# Patient Record
Sex: Female | Born: 1980 | Race: Black or African American | Hispanic: No | Marital: Single | Smoking: Current every day smoker
Health system: Southern US, Community
[De-identification: ages and names within clinical notes are randomized; demographics above are authoritative.]

## PROBLEM LIST (undated history)

## (undated) DIAGNOSIS — J45909 Unspecified asthma, uncomplicated: Secondary | ICD-10-CM

## (undated) DIAGNOSIS — N939 Abnormal uterine and vaginal bleeding, unspecified: Secondary | ICD-10-CM

## (undated) HISTORY — DX: Unspecified asthma, uncomplicated: J45.909

---
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
## 1999-07-02 ENCOUNTER — Other Ambulatory Visit: Admission: RE | Admit: 1999-07-02 | Discharge: 1999-07-02 | Payer: Self-pay | Admitting: Obstetrics and Gynecology

## 1999-09-11 ENCOUNTER — Inpatient Hospital Stay (HOSPITAL_COMMUNITY): Admission: AD | Admit: 1999-09-11 | Discharge: 1999-09-13 | Payer: Self-pay | Admitting: *Deleted

## 2003-09-26 ENCOUNTER — Ambulatory Visit: Payer: Self-pay | Admitting: Family Medicine

## 2003-09-26 ENCOUNTER — Inpatient Hospital Stay (HOSPITAL_COMMUNITY): Admission: AD | Admit: 2003-09-26 | Discharge: 2003-09-29 | Payer: Self-pay | Admitting: Family Medicine

## 2004-07-20 ENCOUNTER — Ambulatory Visit: Payer: Self-pay | Admitting: Psychiatry

## 2004-07-20 ENCOUNTER — Encounter: Payer: Self-pay | Admitting: Emergency Medicine

## 2004-07-20 ENCOUNTER — Inpatient Hospital Stay (HOSPITAL_COMMUNITY): Admission: RE | Admit: 2004-07-20 | Discharge: 2004-07-23 | Payer: Self-pay | Admitting: Psychiatry

## 2005-02-02 ENCOUNTER — Inpatient Hospital Stay (HOSPITAL_COMMUNITY): Admission: AD | Admit: 2005-02-02 | Discharge: 2005-02-03 | Payer: Self-pay | Admitting: Family Medicine

## 2005-06-11 ENCOUNTER — Ambulatory Visit: Payer: Self-pay | Admitting: *Deleted

## 2005-06-11 ENCOUNTER — Inpatient Hospital Stay (HOSPITAL_COMMUNITY): Admission: AD | Admit: 2005-06-11 | Discharge: 2005-06-13 | Payer: Self-pay | Admitting: *Deleted

## 2007-06-26 IMAGING — US US OB COMP +14 WK
1 series · 13 of 28 positions shown · non-contrast
Comparison: none

CLINICAL DATA: 24-year-old female with estimated 23 week gestation.  No prenatal care.  Pelvic pain and discharge.

[Series 1: us ob comp +14 wk · 13 of 47 slices shown]
[im 2/47]
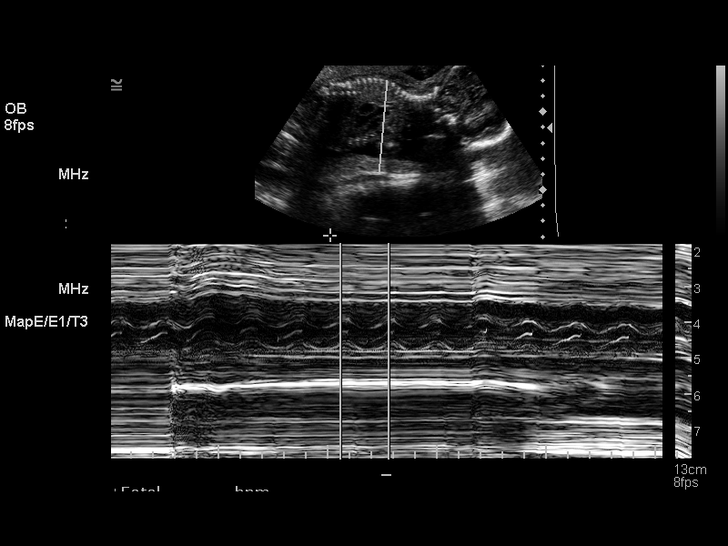
[im 6/47]
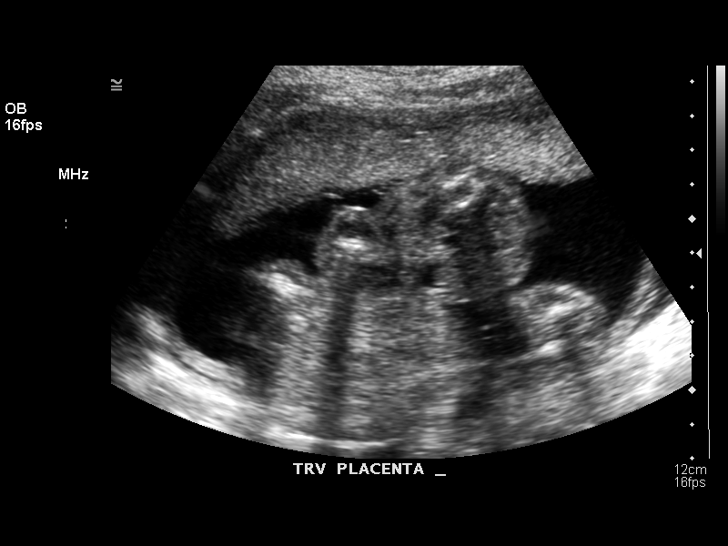
[im 9/47]
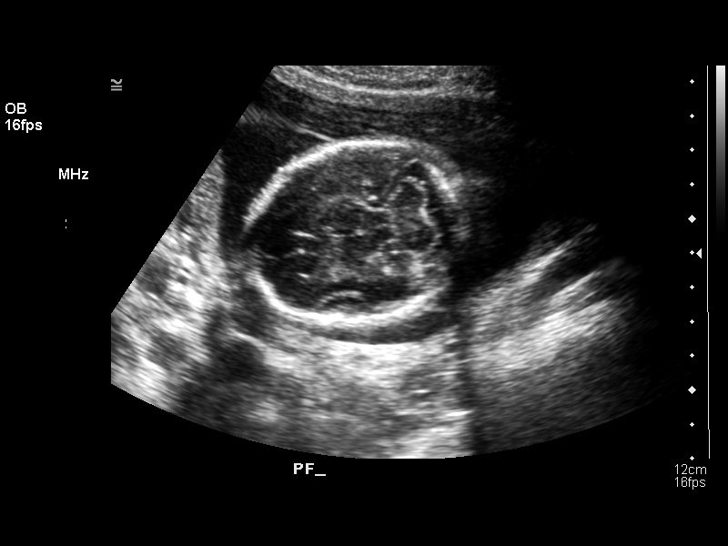
[im 12/47]
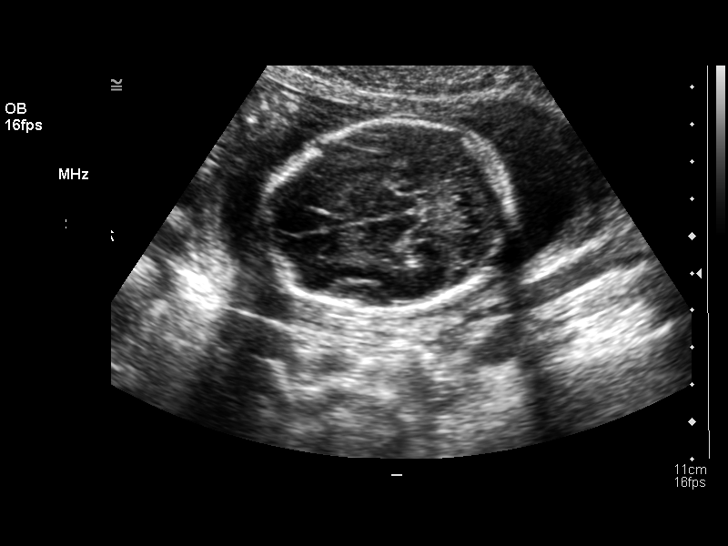
[im 16/47]
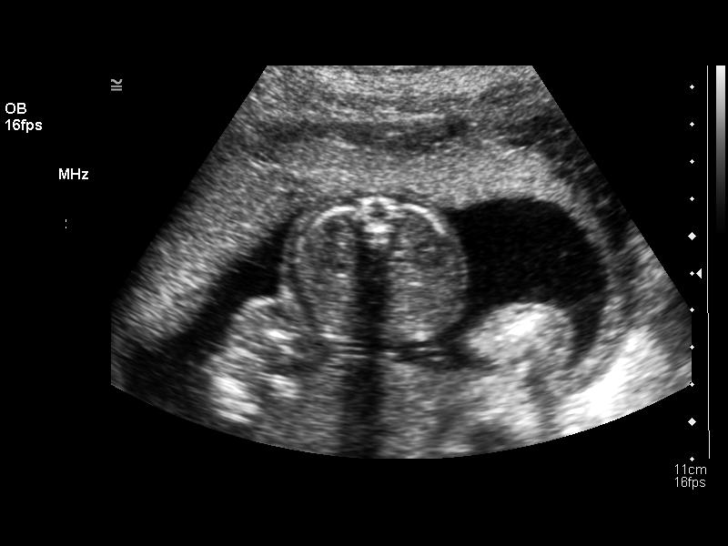
[im 19/47]
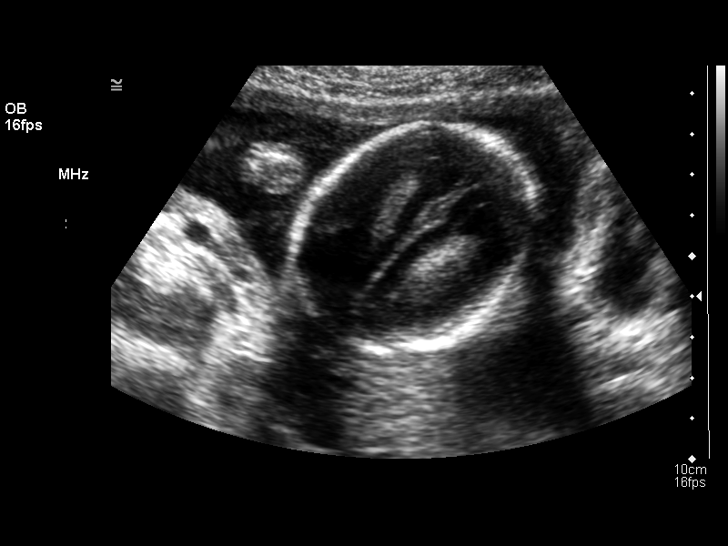
[im 24/47]
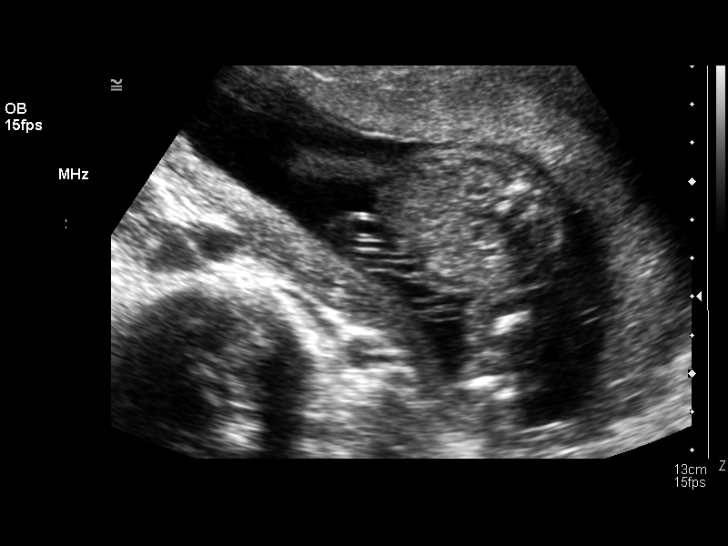
[im 28/47]
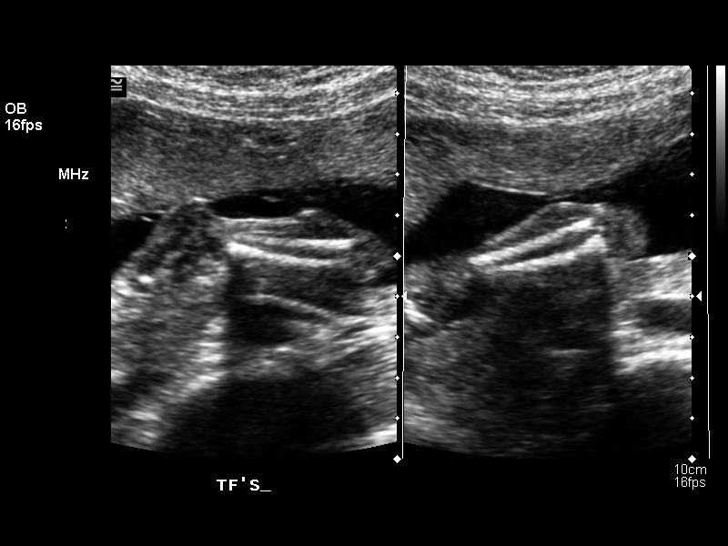
[im 31/47]
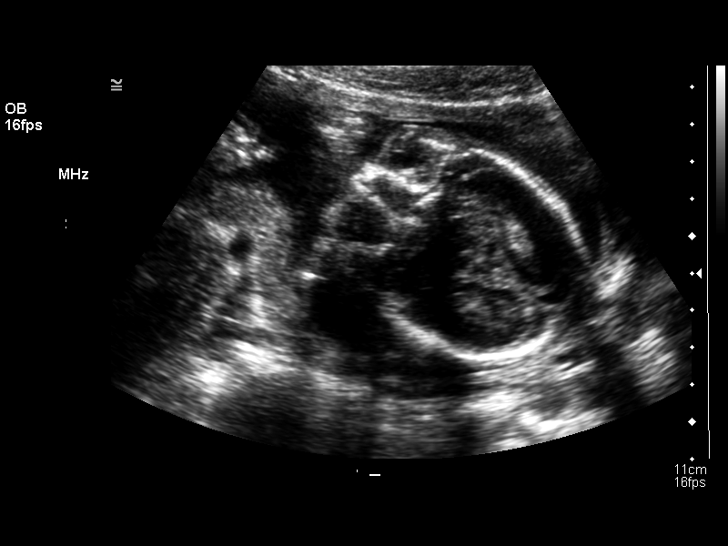
[im 35/47]
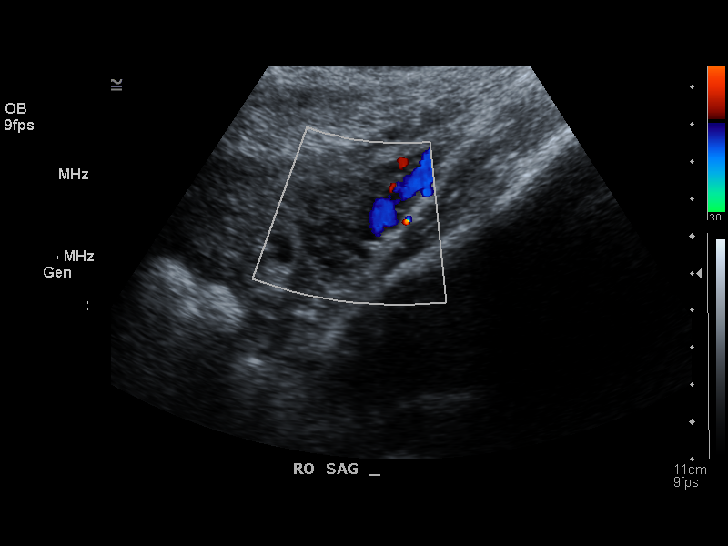
[im 38/47]
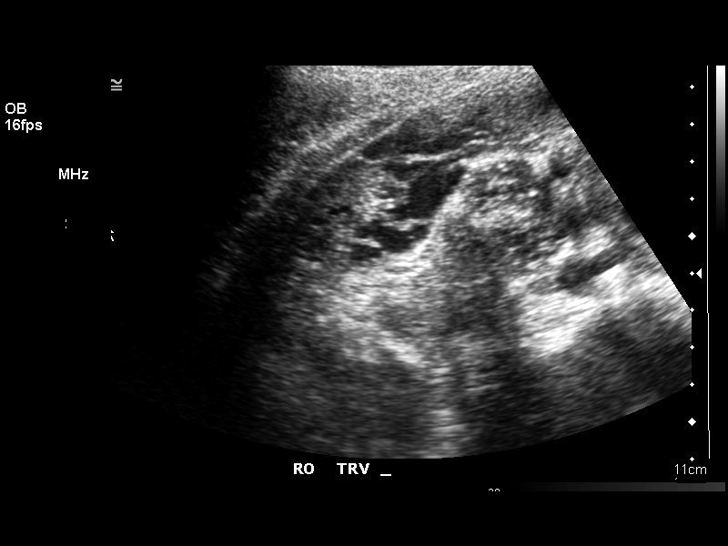
[im 41/47]
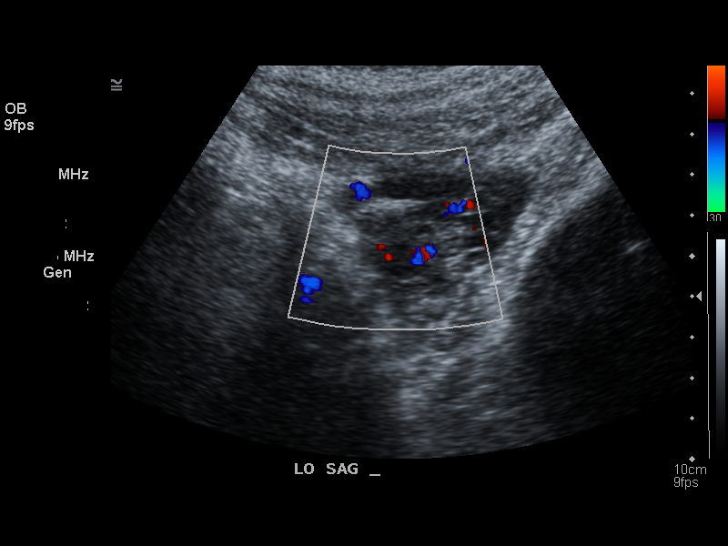
[im 45/47]
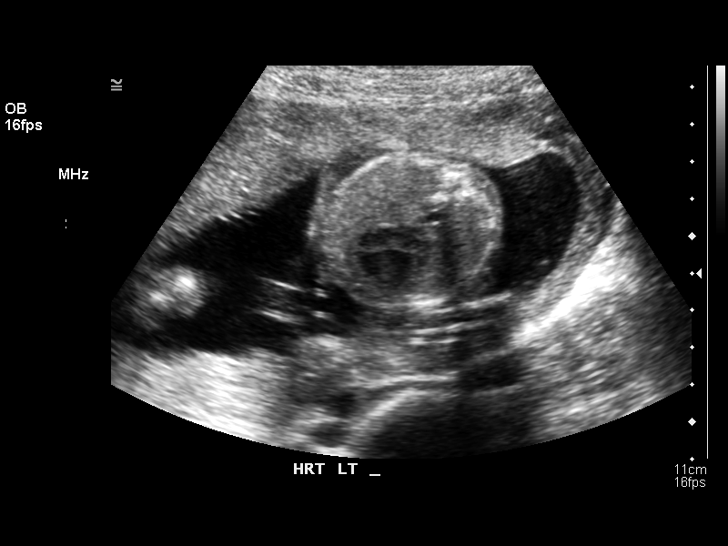

[13 of 28 positions shown; findings below may reference images not displayed]

OBSTETRICAL ULTRASOUND:
 Number of Fetuses: 1
 Heart Rate:  136
 Movement:  Yes
 Breathing:  No  
 Presentation:  Cephalic
 Placental Location:  Anterior
 Grade:  I
 Previa: No
 Amniotic Fluid (Subjective):  Normal
 Amniotic Fluid (Objective):   4.1 cm Vertical pocket 

 FETAL BIOMETRY
 BPD:   5.2 cm  21 w 5 d
 HC:   19.5 cm  21 w 5 d
 AC:   16.0 cm   21 w 0 d
 FL:    3.7 cm  21 w 5 d

 MEAN GA:  21 w 4 d  US EDC:  06/11/05

 FETAL ANATOMY
 Lateral Ventricles:    Visualized 
 Thalami/CSP:      Visualized 
 Posterior Fossa:  Visualized 
 Nuchal Region:    N/A
 Spine:      Not visualized 
 4 Chamber Heart on Left:      Not visualized 
 Stomach on Left:      Visualized 
 3 Vessel Cord:    Visualized 
 Cord Insertion site:    Visualized 
 Kidneys:  Visualized 
 Bladder:  Visualized 
 Extremities:      Visualized 

 ADDITIONAL ANATOMY VISUALIZED:  Orbits, diaphragm, heel, and 5th digit.

 Evaluation limited by:  Fetal position.

 MATERNAL UTERINE AND ADNEXAL FINDINGS
 Cervix:   3.5 cm Transabdominally.  Normal ovaries.
IMPRESSION: 1.  Single living intrauterine gestation in cephalic presentation with gestational age of 21 weeks 4 days by today?s ultrasound.
 2.  Fetal anatomy as described.  Spine, heart and nuchal regions were not well-evaluated secondary to fetal position.  Consider follow-up.

## 2011-12-03 ENCOUNTER — Emergency Department (HOSPITAL_COMMUNITY)
Admission: EM | Admit: 2011-12-03 | Discharge: 2011-12-03 | Disposition: A | Discharge status: Home / Self Care | Payer: Self-pay | Attending: Emergency Medicine | Admitting: Emergency Medicine

## 2011-12-03 ENCOUNTER — Emergency Department (HOSPITAL_COMMUNITY): Payer: Self-pay

## 2011-12-03 ENCOUNTER — Encounter (HOSPITAL_COMMUNITY): Payer: Self-pay | Admitting: *Deleted

## 2011-12-03 DIAGNOSIS — F17200 Nicotine dependence, unspecified, uncomplicated: Secondary | ICD-10-CM | POA: Insufficient documentation

## 2011-12-03 DIAGNOSIS — Y9389 Activity, other specified: Secondary | ICD-10-CM | POA: Insufficient documentation

## 2011-12-03 DIAGNOSIS — S92415D Nondisplaced fracture of proximal phalanx of left great toe, subsequent encounter for fracture with routine healing: Secondary | ICD-10-CM

## 2011-12-03 DIAGNOSIS — S92919A Unspecified fracture of unspecified toe(s), initial encounter for closed fracture: Secondary | ICD-10-CM | POA: Insufficient documentation

## 2011-12-03 DIAGNOSIS — Y92009 Unspecified place in unspecified non-institutional (private) residence as the place of occurrence of the external cause: Secondary | ICD-10-CM | POA: Insufficient documentation

## 2011-12-03 DIAGNOSIS — W2203XA Walked into furniture, initial encounter: Secondary | ICD-10-CM | POA: Insufficient documentation

## 2011-12-03 MED ORDER — OXYCODONE-ACETAMINOPHEN 5-325 MG PO TABS: 2.0000 | ORAL_TABLET | ORAL | Status: DC | PRN

## 2011-12-03 MED ORDER — IBUPROFEN 800 MG PO TABS
800.0000 mg | ORAL_TABLET | Freq: Once | ORAL | Status: AC
  Administered 2011-12-03: 800 mg via ORAL
  Filled 2011-12-03: qty 1

## 2011-12-03 NOTE — ED Provider Notes (Signed)
Medical screening examination/treatment/procedure(s) were performed by non-physician practitioner and as supervising physician I was immediately available for consultation/collaboration.  Dlynn Ranes, MD 12/03/11 2236 

## 2011-12-03 NOTE — ED Notes (Signed)
Pt reports that she was rearranging furniture and dropped something on her Rt Great toe; c/o pain and swelling to toe, unable to bend without pain.

## 2011-12-03 NOTE — ED Provider Notes (Signed)
History     CSN: [id number]  Arrival date & time 12/03/11  2018   First MD Initiated Contact with Patient 12/03/11 2106      Chief Complaint  Patient presents with  . Toe Injury    (Consider location/radiation/quality/duration/timing/severity/associated sxs/prior treatment) Patient is a 31 y.o. female presenting with lower extremity pain. The history is provided by the patient. No language interpreter was used.  Foot Pain This is a new problem. The current episode started today. The problem occurs constantly. The problem has been unchanged. Associated symptoms include joint swelling. Pertinent negatives include no nausea, vomiting or weakness. The symptoms are aggravated by walking. She has tried NSAIDs for the symptoms. The treatment provided mild relief.  31yo female with R great toe injury after dropping furniture on her toe.  Toe is inflamed and painful. No deformity noted.   History reviewed. No pertinent past medical history.  History reviewed. No pertinent past surgical history.  No family history on file.  History  Substance Use Topics  . Smoking status: Current Every Day Smoker -- 0.5 packs/day    Types: Cigarettes  . Smokeless tobacco: Not on file  . Alcohol Use: No    OB History    Grav Para Term Preterm Abortions TAB SAB Ect Mult Living                  Review of Systems  Constitutional: Negative.   HENT: Negative.   Eyes: Negative.   Respiratory: Negative.   Cardiovascular: Negative.   Gastrointestinal: Negative.  Negative for nausea and vomiting.  Musculoskeletal: Positive for joint swelling and gait problem. Negative for back pain.       R great toe pain  Neurological: Negative.  Negative for weakness.  Psychiatric/Behavioral: Negative.   All other systems reviewed and are negative.    Allergies  Review of patient's allergies indicates no known allergies.  Home Medications   Current Outpatient Rx  Name  Route  Sig  Dispense  Refill  .  IBUPROFEN 200 MG PO TABS   Oral   Take 600 mg by mouth every 6 (six) hours as needed.         Marland Kitchen PARAGARD INTRAUTERINE COPPER IU   Intrauterine   1 each by Intrauterine route continuous. Birth control. Copper IUD.           BP 132/81  Pulse 91  Temp 98.9 F (37.2 C) (Oral)  Resp 18  Ht 5\' 3"  (1.6 m)  Wt 140 lb (63.504 kg)  BMI 24.80 kg/m2  SpO2 99%  LMP 11/25/2011  Physical Exam  Nursing note and vitals reviewed. Constitutional: She is oriented to person, place, and time. She appears well-developed and well-nourished.  HENT:  Head: Normocephalic and atraumatic.  Eyes: Conjunctivae normal and EOM are normal. Pupils are equal, round, and reactive to light.  Neck: Normal range of motion. Neck supple.  Cardiovascular: Normal rate.   Pulmonary/Chest: Effort normal.  Abdominal: Soft.  Musculoskeletal: Normal range of motion. She exhibits no edema and no tenderness.       R great toe tenderness no deformity +cms  Neurological: She is alert and oriented to person, place, and time. She has normal reflexes.  Skin: Skin is warm and dry.  Psychiatric: She has a normal mood and affect.    ED Course  Procedures (including critical care time)  Labs Reviewed - No data to display Dg Toe Great Right  12/03/2011  *RADIOLOGY REPORT*  Clinical Data: Or drop for  sure on right great toe, now with pain and swelling throughout the digit  RIGHT GREAT TOE  Comparison: None.  Findings:  There is a nondisplaced complex oblique fracture of the proximal phalanx of the great toe/first digit without definite intra- articular extension.  Regional soft tissues are normal.  No radiopaque foreign body.  Joint spaces are preserved.  IMPRESSION: Nondisplaced complex oblique fracture of the proximal phalanx of the great toe without definite intra-articular extension.  No radiopaque foreign body.   Original Report Authenticated By: Tacey Ruiz, MD      1. Fx R great toe    MDM  R great toe fx  nondisplaced reviewed by myself.   Post op boot applied.  Will get cane at [employer].  Iburprofen RICE and percocet for pain.  Return to ER for severe pain or hot to touch/color changes.  Follow up Dr Shelle Iron with ortho in the next week.            Remi Haggard, NP 12/03/11 2115

## 2012-10-23 ENCOUNTER — Inpatient Hospital Stay (HOSPITAL_COMMUNITY)
Admission: AD | Admit: 2012-10-23 | Discharge: 2012-10-23 | Disposition: A | Discharge status: Home / Self Care | Payer: Self-pay | Source: Ambulatory Visit | Attending: Obstetrics and Gynecology | Admitting: Obstetrics and Gynecology

## 2012-10-23 ENCOUNTER — Encounter (HOSPITAL_COMMUNITY): Payer: Self-pay | Admitting: *Deleted

## 2012-10-23 DIAGNOSIS — D649 Anemia, unspecified: Secondary | ICD-10-CM | POA: Insufficient documentation

## 2012-10-23 DIAGNOSIS — A5901 Trichomonal vulvovaginitis: Secondary | ICD-10-CM | POA: Insufficient documentation

## 2012-10-23 DIAGNOSIS — N938 Other specified abnormal uterine and vaginal bleeding: Secondary | ICD-10-CM | POA: Insufficient documentation

## 2012-10-23 DIAGNOSIS — N949 Unspecified condition associated with female genital organs and menstrual cycle: Secondary | ICD-10-CM | POA: Insufficient documentation

## 2012-10-23 HISTORY — DX: Abnormal uterine and vaginal bleeding, unspecified: N93.9

## 2012-10-23 LAB — URINALYSIS, ROUTINE W REFLEX MICROSCOPIC
Bilirubin Urine: NEGATIVE
Glucose, UA: NEGATIVE mg/dL
Ketones, ur: NEGATIVE mg/dL
Protein, ur: NEGATIVE mg/dL
Urobilinogen, UA: 1 mg/dL (ref 0.0–1.0)
pH: 6 (ref 5.0–8.0)

## 2012-10-23 LAB — WET PREP, GENITAL

## 2012-10-23 LAB — URINE MICROSCOPIC-ADD ON

## 2012-10-23 LAB — CBC
HCT: 27.9 % — ABNORMAL LOW (ref 36.0–46.0)
Hemoglobin: 9.3 g/dL — ABNORMAL LOW (ref 12.0–15.0)
MCH: 28 pg (ref 26.0–34.0)
MCV: 84 fL (ref 78.0–100.0)
RBC: 3.32 MIL/uL — ABNORMAL LOW (ref 3.87–5.11)
WBC: 5.3 10*3/uL (ref 4.0–10.5)

## 2012-10-23 MED ORDER — ONDANSETRON 4 MG PO TBDP
4.0000 mg | ORAL_TABLET | Freq: Once | ORAL | Status: AC
  Administered 2012-10-23: 4 mg via ORAL
  Filled 2012-10-23: qty 1

## 2012-10-23 MED ORDER — MEGESTROL ACETATE 20 MG PO TABS: 20.0000 mg | ORAL_TABLET | Freq: Every day | ORAL | Status: DC

## 2012-10-23 MED ORDER — METRONIDAZOLE 500 MG PO TABS
2000.0000 mg | ORAL_TABLET | Freq: Once | ORAL | Status: AC
  Administered 2012-10-23: 2000 mg via ORAL
  Filled 2012-10-23: qty 4

## 2012-10-23 NOTE — MAU Provider Note (Signed)
History     CSN: [id number]  Arrival date and time: 10/23/12 1313   First Provider Initiated Contact with Patient 10/23/12 1456      Chief Complaint  Patient presents with  . Vaginal Bleeding   HPI  [name] is a 32 y.o. non-pregnant female [phone] who presents with vaginal bleeding. She has a copper IUD in place that she has had for 5 years without problems. She started bleeding September 28, 2012 and the bleeding has continued everyday. She is changing/ soaking a pad every 20 mins. She has bought over night pads and depends and still continues to soak them.   OB History   Grav Para Term Preterm Abortions TAB SAB Ect Mult Living   5 5 4 1      5       Past Medical History  Diagnosis Date  . Abnormal vaginal bleeding     Past Surgical History  Procedure Laterality Date  . Vaginal delivery      History reviewed. No pertinent family history.  History  Substance Use Topics  . Smoking status: Current Every Day Smoker -- 0.50 packs/day    Types: Cigarettes  . Smokeless tobacco: Not on file  . Alcohol Use: Yes     Comment: occas.    Allergies: No Known Allergies  Prescriptions prior to admission  Medication Sig Dispense Refill  . ibuprofen (ADVIL,MOTRIN) 200 MG tablet Take 600 mg by mouth every 6 (six) hours as needed.      Marland Kitchen PARAGARD INTRAUTERINE COPPER IU 1 each by Intrauterine route continuous. Birth control. Copper IUD.       Results for orders placed during the hospital encounter of 10/23/12 (from the past 24 hour(s))  URINALYSIS, ROUTINE W REFLEX MICROSCOPIC     Status: Abnormal   Collection Time    10/23/12  1:30 PM      Result Value Range   Color, Urine YELLOW  YELLOW   APPearance CLOUDY (*) CLEAR   Specific Gravity, Urine >1.030 (*) 1.005 - 1.030   pH 6.0  5.0 - 8.0   Glucose, UA NEGATIVE  NEGATIVE mg/dL   Hgb urine dipstick LARGE (*) NEGATIVE   Bilirubin Urine NEGATIVE  NEGATIVE   Ketones, ur NEGATIVE  NEGATIVE mg/dL   Protein, ur  NEGATIVE  NEGATIVE mg/dL   Urobilinogen, UA 1.0  0.0 - 1.0 mg/dL   Nitrite NEGATIVE  NEGATIVE   Leukocytes, UA NEGATIVE  NEGATIVE  URINE MICROSCOPIC-ADD ON     Status: Abnormal   Collection Time    10/23/12  1:30 PM      Result Value Range   Squamous Epithelial / LPF FEW (*) RARE   RBC / HPF TOO NUMEROUS TO COUNT  <3 RBC/hpf   Bacteria, UA FEW (*) RARE   Urine-Other MUCOUS PRESENT    POCT PREGNANCY, URINE     Status: None   Collection Time    10/23/12  1:38 PM      Result Value Range   Preg Test, Ur NEGATIVE  NEGATIVE  CBC     Status: Abnormal   Collection Time    10/23/12  2:46 PM      Result Value Range   WBC 5.3  4.0 - 10.5 K/uL   RBC 3.32 (*) 3.87 - 5.11 MIL/uL   Hemoglobin 9.3 (*) 12.0 - 15.0 g/dL   HCT 13.0 (*) 86.5 - 78.4 %   MCV 84.0  78.0 - 100.0 fL   MCH 28.0  26.0 -  34.0 pg   MCHC 33.3  30.0 - 36.0 g/dL   RDW 78.2  95.6 - 21.3 %   Platelets 213  150 - 400 K/uL   Results for orders placed during the hospital encounter of 10/23/12 (from the past 24 hour(s))  URINALYSIS, ROUTINE W REFLEX MICROSCOPIC     Status: Abnormal   Collection Time    10/23/12  1:30 PM      Result Value Range   Color, Urine YELLOW  YELLOW   APPearance CLOUDY (*) CLEAR   Specific Gravity, Urine >1.030 (*) 1.005 - 1.030   pH 6.0  5.0 - 8.0   Glucose, UA NEGATIVE  NEGATIVE mg/dL   Hgb urine dipstick LARGE (*) NEGATIVE   Bilirubin Urine NEGATIVE  NEGATIVE   Ketones, ur NEGATIVE  NEGATIVE mg/dL   Protein, ur NEGATIVE  NEGATIVE mg/dL   Urobilinogen, UA 1.0  0.0 - 1.0 mg/dL   Nitrite NEGATIVE  NEGATIVE   Leukocytes, UA NEGATIVE  NEGATIVE  URINE MICROSCOPIC-ADD ON     Status: Abnormal   Collection Time    10/23/12  1:30 PM      Result Value Range   Squamous Epithelial / LPF FEW (*) RARE   RBC / HPF TOO NUMEROUS TO COUNT  <3 RBC/hpf   Bacteria, UA FEW (*) RARE   Urine-Other MUCOUS PRESENT    POCT PREGNANCY, URINE     Status: None   Collection Time    10/23/12  1:38 PM      Result Value  Range   Preg Test, Ur NEGATIVE  NEGATIVE  CBC     Status: Abnormal   Collection Time    10/23/12  2:46 PM      Result Value Range   WBC 5.3  4.0 - 10.5 K/uL   RBC 3.32 (*) 3.87 - 5.11 MIL/uL   Hemoglobin 9.3 (*) 12.0 - 15.0 g/dL   HCT 08.6 (*) 57.8 - 46.9 %   MCV 84.0  78.0 - 100.0 fL   MCH 28.0  26.0 - 34.0 pg   MCHC 33.3  30.0 - 36.0 g/dL   RDW 62.9  52.8 - 41.3 %   Platelets 213  150 - 400 K/uL  WET PREP, GENITAL     Status: Abnormal   Collection Time    10/23/12  3:10 PM      Result Value Range   Yeast Wet Prep HPF POC NONE SEEN  NONE SEEN   Trich, Wet Prep FEW (*) NONE SEEN   Clue Cells Wet Prep HPF POC NONE SEEN  NONE SEEN   WBC, Wet Prep HPF POC FEW (*) NONE SEEN    Review of Systems  Constitutional: Negative for fever and chills.  Gastrointestinal: Positive for abdominal pain. Negative for nausea, vomiting, diarrhea and constipation.       Right sided pelvic pain   Genitourinary: Negative for dysuria, urgency, frequency and hematuria.       No vaginal discharge. + vaginal bleeding; heavy No dysuria.   Neurological: Positive for weakness. Negative for dizziness.   Physical Exam   Blood pressure 118/73, pulse 86, temperature 98.4 F (36.9 C), temperature source Oral, resp. rate 18, height 5' 3.5" (1.613 m), weight 65.772 kg (145 lb), last menstrual period 09/28/2012.  Physical Exam  Constitutional: She is oriented to person, place, and time. She appears well-developed and well-nourished. No distress.  Eyes: Pupils are equal, round, and reactive to light.  Neck: Normal range of motion.  Respiratory: Effort normal.  GI: Soft. She exhibits no distension. There is no tenderness. There is no rebound and no guarding.  Genitourinary: Vaginal discharge found.  Speculum exam: Vagina - Small amount of frothy, bubbly, dark red blood in vaginal canal, strong odor, two dime size clots removed.  Cervix - moderate, active, frothy bleeding from os  IUD strings visualized   Bimanual exam: Cervix closed, no CMT Uterus non tender, normal size Adnexa non tender, no masses bilaterally GC/Chlam, wet prep done Chaperone present for exam.   Neurological: She is alert and oriented to person, place, and time.  Skin: Skin is warm and dry. She is not diaphoretic.    MAU Course  Procedures  MDM CBC Wet prep GC/Chlamydia   Consulted with Dr. Jolayne Panther and a treatment plan was discussed.   Assessment and Plan  A: Heavy vaginal bleeding Trichomonas Vaginitis Anemia   P Discharge home Bleeding precautions discussed  You have been diagnosed with a sexually transmitted disease.  You have been treated today and your partner(s) will need to be treated.  No sex until 10 days after you finished your medicine and no sex until 10 days after your partner has taken their medication. RX: Megace: Take two tablets 40 mg three times per day time three days,  then take two tablets 40 mg two times per day times three days, then take two tablets 40 mg once per day, then 1 tablet 40 mg PRN for bleeding  Return to MAU as needed, if symptoms worsen  Start Iron 325 mg BID; ferrous sulfate; take on an empty stomach 1 hour before meals Referral made to the clinic, they will call you to schedule appointment.   Natthew Marlatt IRENE FNP-C 10/23/2012, 4:54 PM

## 2012-10-23 NOTE — MAU Note (Signed)
Started her period about a month ago and has continued to bleed, varies in amount. Cramping on right side. Has copper IUD X 5 years. Has not had irregular bleeding until now.

## 2012-10-23 NOTE — MAU Provider Note (Signed)
Attestation of Attending Supervision of Advanced Practitioner (CNM/NP): Evaluation and management procedures were performed by the Advanced Practitioner under my supervision and collaboration.  I have reviewed the Advanced Practitioner's note and chart, and I agree with the management and plan.  Jerin Franzel 10/23/2012 5:06 PM

## 2012-10-24 LAB — GC/CHLAMYDIA PROBE AMP
CT Probe RNA: NEGATIVE
GC Probe RNA: NEGATIVE

## 2012-10-26 ENCOUNTER — Encounter: Payer: Self-pay | Admitting: Medical

## 2012-11-30 ENCOUNTER — Ambulatory Visit: Payer: Self-pay | Admitting: Medical

## 2013-11-25 ENCOUNTER — Encounter (HOSPITAL_COMMUNITY): Payer: Self-pay | Admitting: *Deleted

## 2013-11-29 ENCOUNTER — Emergency Department (HOSPITAL_COMMUNITY)
Admission: EM | Admit: 2013-11-29 | Discharge: 2013-11-29 | Disposition: A | Discharge status: Home / Self Care | Payer: No Typology Code available for payment source | Attending: Emergency Medicine | Admitting: Emergency Medicine

## 2013-11-29 ENCOUNTER — Encounter (HOSPITAL_COMMUNITY): Payer: Self-pay | Admitting: Emergency Medicine

## 2013-11-29 DIAGNOSIS — Z720 Tobacco use: Secondary | ICD-10-CM | POA: Insufficient documentation

## 2013-11-29 DIAGNOSIS — K529 Noninfective gastroenteritis and colitis, unspecified: Secondary | ICD-10-CM | POA: Insufficient documentation

## 2013-11-29 DIAGNOSIS — Z3202 Encounter for pregnancy test, result negative: Secondary | ICD-10-CM | POA: Insufficient documentation

## 2013-11-29 DIAGNOSIS — R112 Nausea with vomiting, unspecified: Secondary | ICD-10-CM | POA: Diagnosis present

## 2013-11-29 DIAGNOSIS — R51 Headache: Secondary | ICD-10-CM

## 2013-11-29 DIAGNOSIS — R519 Headache, unspecified: Secondary | ICD-10-CM

## 2013-11-29 LAB — PREGNANCY, URINE: PREG TEST UR: NEGATIVE

## 2013-11-29 LAB — CBC WITH DIFFERENTIAL/PLATELET
BASOS ABS: 0 10*3/uL (ref 0.0–0.1)
Basophils Relative: 0 % (ref 0–1)
Eosinophils Absolute: 0.1 10*3/uL (ref 0.0–0.7)
Eosinophils Relative: 1 % (ref 0–5)
HEMATOCRIT: 30.7 % — AB (ref 36.0–46.0)
Hemoglobin: 9.5 g/dL — ABNORMAL LOW (ref 12.0–15.0)
LYMPHS PCT: 39 % (ref 12–46)
Lymphs Abs: 2.4 10*3/uL (ref 0.7–4.0)
MCH: 24.9 pg — AB (ref 26.0–34.0)
MCHC: 30.9 g/dL (ref 30.0–36.0)
MCV: 80.6 fL (ref 78.0–100.0)
MONO ABS: 0.6 10*3/uL (ref 0.1–1.0)
Monocytes Relative: 10 % (ref 3–12)
NEUTROS ABS: 3.2 10*3/uL (ref 1.7–7.7)
NEUTROS PCT: 50 % (ref 43–77)
Platelets: 240 10*3/uL (ref 150–400)
RBC: 3.81 MIL/uL — ABNORMAL LOW (ref 3.87–5.11)
RDW: 18.3 % — AB (ref 11.5–15.5)
WBC: 6.3 10*3/uL (ref 4.0–10.5)

## 2013-11-29 LAB — COMPREHENSIVE METABOLIC PANEL
ALBUMIN: 3.4 g/dL — AB (ref 3.5–5.2)
ALT: 23 U/L (ref 0–35)
ANION GAP: 13 (ref 5–15)
AST: 25 U/L (ref 0–37)
Alkaline Phosphatase: 54 U/L (ref 39–117)
BUN: 6 mg/dL (ref 6–23)
CALCIUM: 9.3 mg/dL (ref 8.4–10.5)
CHLORIDE: 104 meq/L (ref 96–112)
CO2: 23 mEq/L (ref 19–32)
CREATININE: 0.64 mg/dL (ref 0.50–1.10)
GFR calc Af Amer: >90 mL/min (ref >90)
GLUCOSE: 93 mg/dL (ref 70–99)
Potassium: 4.1 mEq/L (ref 3.7–5.3)
SODIUM: 140 meq/L (ref 137–147)
TOTAL PROTEIN: 7.1 g/dL (ref 6.0–8.3)
Total Bilirubin: <0.2 mg/dL — ABNORMAL LOW (ref 0.3–1.2)

## 2013-11-29 LAB — LIPASE, BLOOD: LIPASE: 18 U/L (ref 11–59)

## 2013-11-29 LAB — URINALYSIS, ROUTINE W REFLEX MICROSCOPIC
BILIRUBIN URINE: NEGATIVE
Glucose, UA: NEGATIVE mg/dL
HGB URINE DIPSTICK: NEGATIVE
Ketones, ur: NEGATIVE mg/dL
Leukocytes, UA: NEGATIVE
NITRITE: NEGATIVE
PH: 7 (ref 5.0–8.0)
Protein, ur: NEGATIVE mg/dL
Specific Gravity, Urine: 1.02 (ref 1.005–1.030)
Urobilinogen, UA: 0.2 mg/dL (ref 0.0–1.0)

## 2013-11-29 MED ORDER — KETOROLAC TROMETHAMINE 30 MG/ML IJ SOLN
30.0000 mg | Freq: Once | INTRAMUSCULAR | Status: AC
  Administered 2013-11-29: 30 mg via INTRAVENOUS
  Filled 2013-11-29: qty 1

## 2013-11-29 MED ORDER — SODIUM CHLORIDE 0.9 % IV BOLUS (SEPSIS)
1000.0000 mL | Freq: Once | INTRAVENOUS | Status: AC
  Administered 2013-11-29: 1000 mL via INTRAVENOUS

## 2013-11-29 MED ORDER — PROMETHAZINE HCL 25 MG PO TABS: 25.0000 mg | ORAL_TABLET | Freq: Three times a day (TID) | ORAL | Status: DC | PRN

## 2013-11-29 MED ORDER — ONDANSETRON HCL 4 MG/2ML IJ SOLN
4.0000 mg | Freq: Once | INTRAMUSCULAR | Status: AC
  Administered 2013-11-29: 4 mg via INTRAVENOUS
  Filled 2013-11-29: qty 2

## 2013-11-29 MED ORDER — SODIUM CHLORIDE 0.9 % IV BOLUS (SEPSIS): 1000.0000 mL | Freq: Once | INTRAVENOUS | Status: DC

## 2013-11-29 NOTE — Progress Notes (Signed)
  CARE MANAGEMENT ED NOTE 11/29/2013  Patient:  Lisa Mack,Lisa Mack   Account Number:  [id number]  Date Initiated:  11/29/2013  Documentation initiated by:  Radford PaxFERRERO,Zayon Trulson  Subjective/Objective Assessment:   Patient presents to Ed with headache vomiting and diarrhea     Subjective/Objective Assessment Detail:     Action/Plan:   Action/Plan Detail:   Anticipated DC Date:       Status Recommendation to Physician:   Result of Recommendation:    Other ED Services  Consult Working Plan    DC Planning Services  Other  PCP issues    Choice offered to / List presented to:            Status of service:  Completed, signed off  ED Comments:   ED Comments Detail:  EDCM spoke to patient at bedside.  Patient confirms she has [employer] insurance without a pcp.  [facility] who accept [employer] insurnace within a ten mile radius of patient's zip [place] [phone].  Patient thankful for resources.  No further EDCM needs at this time.

## 2013-11-29 NOTE — ED Notes (Signed)
Pt c/o headache, vomiting, diarrhea.

## 2013-11-29 NOTE — Discharge Instructions (Signed)
Return here as needed.  Follow-up with a primary care doctor. slowly increase your fluid intake

## 2013-11-29 NOTE — ED Provider Notes (Signed)
CSN: [id number]     Arrival date & time 11/29/13  1719 History   First MD Initiated Contact with Patient 11/29/13 1848     Chief Complaint  Patient presents with  . Headache  . Emesis     (Consider location/radiation/quality/duration/timing/severity/associated sxs/prior Treatment) HPI Patient presents to the emergency department with headache, vomiting and diarrhea over the last 24 hours.  Patient states she started with a frontal headache yesterday and then developed nausea, vomiting and diarrhea.  The patient states that her headache has improved somewhat and her vomiting has lessened in the last few hours.  The patient states she did not take any medications prior to arrival.  The patient states that nothing seems to make her condition better or worse.  The patient denies chest pain, shortness of breath, weakness, dizziness, back pain, dysuria, hematuria, hematemesis, rash, fever, cough, runny nose, sore throat, near syncope or syncope Past Medical History  Diagnosis Date  . Abnormal vaginal bleeding    Past Surgical History  Procedure Laterality Date  . Vaginal delivery     History reviewed. No pertinent family history. History  Substance Use Topics  . Smoking status: Current Every Day Smoker -- 0.50 packs/day    Types: Cigarettes  . Smokeless tobacco: Not on file  . Alcohol Use: Yes     Comment: occas.   OB History    Gravida Para Term Preterm AB TAB SAB Ectopic Multiple Living   5 5 4 1      5      Review of Systems  All other systems negative except as documented in the HPI. All pertinent positives and negatives as reviewed in the HPI.  Allergies  Review of patient's allergies indicates no known allergies.  Home Medications   Prior to Admission medications   Medication Sig Start Date End Date Taking? Authorizing Provider  ibuprofen (ADVIL,MOTRIN) 200 MG tablet Take 600 mg by mouth every 6 (six) hours as needed.    Historical Provider, MD  megestrol (MEGACE) 20 MG  tablet Take 1 tablet (20 mg total) by mouth daily. 10/23/12   Debbrah AlarJennifer Irene Rasch, NP  PARAGARD INTRAUTERINE COPPER IU 1 each by Intrauterine route continuous. Birth control. Copper IUD.    Historical Provider, MD   BP 121/81 mmHg  Pulse 87  Temp(Src) 98.8 F (37.1 C) (Oral)  Resp 16  SpO2 98% Physical Exam  Constitutional: She is oriented to person, place, and time. She appears well-developed and well-nourished. No distress.  HENT:  Head: Normocephalic and atraumatic.  Mouth/Throat: Oropharynx is clear and moist.  Eyes: EOM are normal. Pupils are equal, round, and reactive to light.  Neck: Normal range of motion. Neck supple.  Cardiovascular: Normal rate, regular rhythm and normal heart sounds.  Exam reveals no gallop and no friction rub.   No murmur heard. Pulmonary/Chest: Effort normal and breath sounds normal. No respiratory distress.  Musculoskeletal: She exhibits no edema.  Neurological: She is alert and oriented to person, place, and time. She exhibits normal muscle tone. Coordination normal.  Skin: Skin is warm and dry. No rash noted. No erythema.  Nursing note and vitals reviewed.   ED Course  Procedures (including critical care time) Labs Review Labs Reviewed  COMPREHENSIVE METABOLIC PANEL - Abnormal; Notable for the following:    Albumin 3.4 (*)    Total Bilirubin <0.2 (*)    All other components within normal limits  CBC WITH DIFFERENTIAL - Abnormal; Notable for the following:    RBC 3.81 (*)  Hemoglobin 9.5 (*)    HCT 30.7 (*)    MCH 24.9 (*)    RDW 18.3 (*)    All other components within normal limits  LIPASE, BLOOD  PREGNANCY, URINE  URINALYSIS, ROUTINE W REFLEX MICROSCOPIC   Patient is given IV fluids and antiemetics.  The patient was reporting that her symptoms were somewhat better   Patient has tolerated oral fluid trial.  She is feeling better at this time is advised to return here as needed.  Patient most likely had a GI illness that has led to her  symptoms  Carlyle DollyChristopher W Cruze Zingaro, PA-C 11/29/13 2130  Linwood DibblesJon Knapp, MD 11/29/13 2132

## 2014-01-31 ENCOUNTER — Emergency Department (HOSPITAL_COMMUNITY)
Admission: EM | Admit: 2014-01-31 | Discharge: 2014-01-31 | Disposition: A | Discharge status: Home / Self Care | Payer: No Typology Code available for payment source | Attending: Emergency Medicine | Admitting: Emergency Medicine

## 2014-01-31 ENCOUNTER — Encounter (HOSPITAL_COMMUNITY): Payer: Self-pay | Admitting: *Deleted

## 2014-01-31 DIAGNOSIS — Z79899 Other long term (current) drug therapy: Secondary | ICD-10-CM | POA: Insufficient documentation

## 2014-01-31 DIAGNOSIS — R0981 Nasal congestion: Secondary | ICD-10-CM | POA: Diagnosis not present

## 2014-01-31 DIAGNOSIS — Z8742 Personal history of other diseases of the female genital tract: Secondary | ICD-10-CM | POA: Insufficient documentation

## 2014-01-31 DIAGNOSIS — Z720 Tobacco use: Secondary | ICD-10-CM | POA: Insufficient documentation

## 2014-01-31 DIAGNOSIS — H109 Unspecified conjunctivitis: Secondary | ICD-10-CM

## 2014-01-31 MED ORDER — ERYTHROMYCIN 5 MG/GM OP OINT: TOPICAL_OINTMENT | OPHTHALMIC | Status: DC

## 2014-01-31 MED ORDER — TETRACAINE HCL 0.5 % OP SOLN
1.0000 [drp] | Freq: Once | OPHTHALMIC | Status: AC
  Administered 2014-01-31: 1 [drp] via OPHTHALMIC
  Filled 2014-01-31: qty 2

## 2014-01-31 MED ORDER — FLUORESCEIN SODIUM 1 MG OP STRP
1.0000 | ORAL_STRIP | Freq: Once | OPHTHALMIC | Status: AC
  Administered 2014-01-31: 1 via OPHTHALMIC
  Filled 2014-01-31: qty 1

## 2014-01-31 NOTE — ED Provider Notes (Signed)
CSN: [id number]     Arrival date & time 01/31/14  4098 History   First MD Initiated Contact with Patient 01/31/14 [phone]     Chief Complaint  Patient presents with  . Conjunctivitis     (Consider location/radiation/quality/duration/timing/severity/associated sxs/prior Treatment) HPI  Lisa Mack is a 34 y.o. female presenting with 2-3 days of eye pain is described as a burning sensation as well as redness that started in her left eye is now gone in her right eye. She Describes a watery discharge and her eyelids are matted when she wakes up. Patient denies contact use or any visual changes. No photophobia or foreign body sensation. She does have some congestion but no headache, slurred speech, weakness. She has used over-the-counter redness eye drops without improvement. She is sent here from work. She works at [employer].   Past Medical History  Diagnosis Date  . Abnormal vaginal bleeding    Past Surgical History  Procedure Laterality Date  . Vaginal delivery     History reviewed. No pertinent family history. History  Substance Use Topics  . Smoking status: Current Every Day Smoker -- 0.50 packs/day    Types: Cigarettes  . Smokeless tobacco: Not on file  . Alcohol Use: Yes     Comment: occas.   OB History    Gravida Para Term Preterm AB TAB SAB Ectopic Multiple Living   Review of Systems  Constitutional: Negative for fever and chills.  HENT: Positive for congestion and rhinorrhea. Negative for sinus pressure and sore throat.   Eyes: Negative for photophobia and visual disturbance.  Neurological: Negative for dizziness and headaches.      Allergies  Review of patient's allergies indicates no known allergies.  Home Medications   Prior to Admission medications   Medication Sig Start Date End Date Taking? Authorizing Provider  erythromycin ophthalmic ointment Place a 1/2 inch ribbon of ointment into the lower eyelid of both eyes four times a day  for 5 days 01/31/14   Louann Sjogren, PA-C  ibuprofen (ADVIL,MOTRIN) 200 MG tablet Take 600 mg by mouth every 6 (six) hours as needed.    Historical Provider, MD  megestrol (MEGACE) 20 MG tablet Take 1 tablet (20 mg total) by mouth daily. 10/23/12   Iona Hansen Rasch, NP  naproxen sodium (ANAPROX) 220 MG tablet Take 220 mg by mouth 2 (two) times daily as needed (pain).    Historical Provider, MD  PARAGARD INTRAUTERINE COPPER IU 1 each by Intrauterine route continuous. Birth control. Copper IUD. 05/30/07   Historical Provider, MD  promethazine (PHENERGAN) 25 MG tablet Take 1 tablet (25 mg total) by mouth every 8 (eight) hours as needed for nausea or vomiting. 11/29/13   Jamesetta Orleans Lawyer, PA-C   BP 134/76 mmHg  Pulse 76  Temp(Src) 98 F (36.7 C) (Oral)  Resp 18  SpO2 100% Physical Exam  Constitutional: She appears well-developed and well-nourished. No distress.  HENT:  Head: Normocephalic and atraumatic.  Mouth/Throat: Oropharynx is clear and moist.  Eyes: Pupils are equal, round, and reactive to light. Lids are everted and swept, no foreign bodies found. Right eye exhibits discharge. Left eye exhibits discharge. Right conjunctiva is not injected. Right conjunctiva has no hemorrhage. Left conjunctiva is injected. Left conjunctiva has no hemorrhage. Right eye exhibits nystagmus. Right eye exhibits normal extraocular motion. Left eye exhibits normal extraocular motion and no nystagmus. Pupils are equal.  Slit lamp  exam:      The right eye shows no corneal abrasion, no hyphema, no hypopyon and no fluorescein uptake.       The left eye shows no corneal abrasion, no hyphema, no hypopyon and no fluorescein uptake.  Visual acuity R and L 20/40 IOP R18 L15 No ciliary flush  Cardiovascular: Normal rate, regular rhythm and normal heart sounds.   Pulmonary/Chest: Effort normal and breath sounds normal. No respiratory distress. She has no wheezes.  Abdominal: Soft. Bowel sounds are normal. She  exhibits no distension. There is no tenderness.  Neurological: She is alert. She exhibits normal muscle tone. Coordination normal.  Skin: Skin is warm and dry. She is not diaphoretic.  Nursing note and vitals reviewed.   ED Course  Procedures (including critical care time) Labs Review Labs Reviewed - No data to display  Imaging Review No results found.   EKG Interpretation None      MDM   Final diagnoses:  Conjunctivitis of left eye   Pt dx likely viral conjunctivitis based on presentation & eye exam. However pt sent from work who is insisting on treatment and pt wants to return. Will treat with erythromycin.  No evidence of HSV or VSV infection. Pt is not a contact lens wearer.  Exam non-concerning for iritis, orbital cellulitis, hyphema, corneal ulcers, corneal abrasions or trauma.  Patient has been instructed to use cool compresses and practice personal hygiene with frequent hand washing.  Patient understands to return , especially if new symptoms including change in vision, purulent drainage, or entrapment occur.    Discussed return precautions with patient. Discussed all results and patient verbalizes understanding and agrees with plan.     Louann SjogrenVictoria L Zakirah Weingart, PA-C 01/31/14 0745  Mirian MoMatthew Gentry, MD 01/31/14 [phone]

## 2014-01-31 NOTE — ED Notes (Signed)
Patient states that symptoms started with the left eye, but now with c/o right eye  Patient states that she has taken homeopathic over the counter eye drops with little to no relief

## 2014-01-31 NOTE — Discharge Instructions (Signed)
Return to the emergency room with worsening of symptoms, new symptoms or with symptoms that are concerning, especially fevers, decreased vision, double vision, sensitivity to light, symptoms not improving after 2-3 days, worsening pain. Repeat all below instructions and follow recommendations. For itching take either Claritin, Allegra, Zyrtec for 5 days.   Conjunctivitis Conjunctivitis is commonly called "pink eye." Conjunctivitis can be caused by bacterial or viral infection, allergies, or injuries. There is usually redness of the lining of the eye, itching, discomfort, and sometimes discharge. There may be deposits of matter along the eyelids. A viral infection usually causes a watery discharge, while a bacterial infection causes a yellowish, thick discharge. Pink eye is very contagious and spreads by direct contact. You may be given antibiotic eyedrops as part of your treatment. Before using your eye medicine, remove all drainage from the eye by washing gently with warm water and cotton balls. Continue to use the medication until you have awakened 2 mornings in a row without discharge from the eye. Do not rub your eye. This increases the irritation and helps spread infection. Use separate towels from other household members. Wash your hands with soap and water before and after touching your eyes. Use cold compresses to reduce pain and sunglasses to relieve irritation from light. Do not wear contact lenses or wear eye makeup until the infection is gone. SEEK MEDICAL CARE IF:   Your symptoms are not better after 3 days of treatment.  You have increased pain or trouble seeing.  The outer eyelids become very red or swollen. Document Released: 02/18/2004 Document Revised: 04/04/2011 Document Reviewed: 01/10/2005 [facility] Patient Information 2015 [employer], [place]. This information is not intended to replace advice given to you by your health care provider. Make sure you discuss any questions you have  with your health care provider.

## 2014-01-31 NOTE — ED Notes (Signed)
PA at bedside.

## 2014-04-25 IMAGING — CR DG TOE GREAT 2+V*R*
3 series · 3 of 3 positions shown · non-contrast
Comparison: None.

CLINICAL DATA: Or drop for sure on right great toe, now with pain
and swelling throughout the digit

RIGHT GREAT TOE

[x toes ap right]
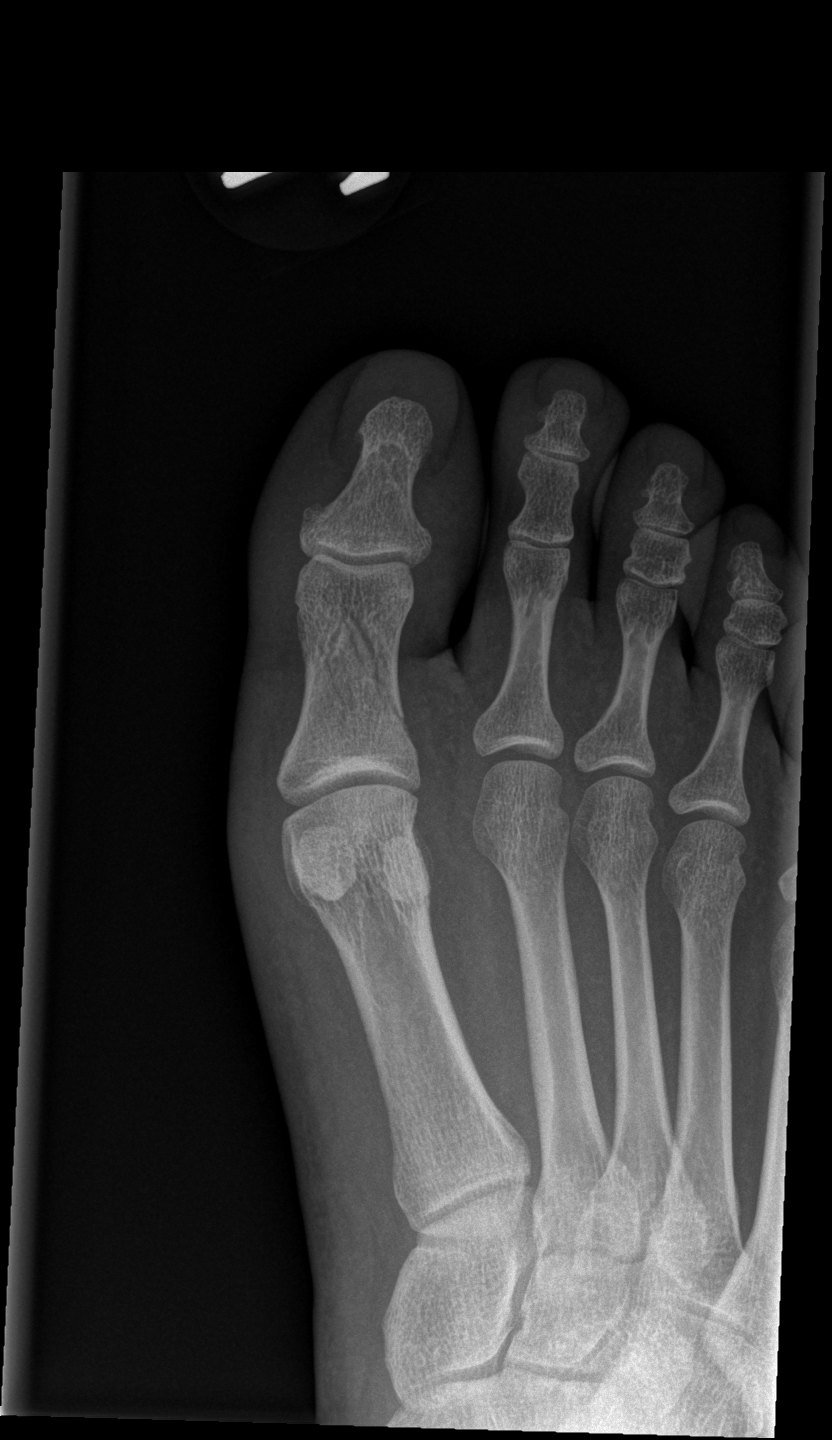

[x toes obl right]
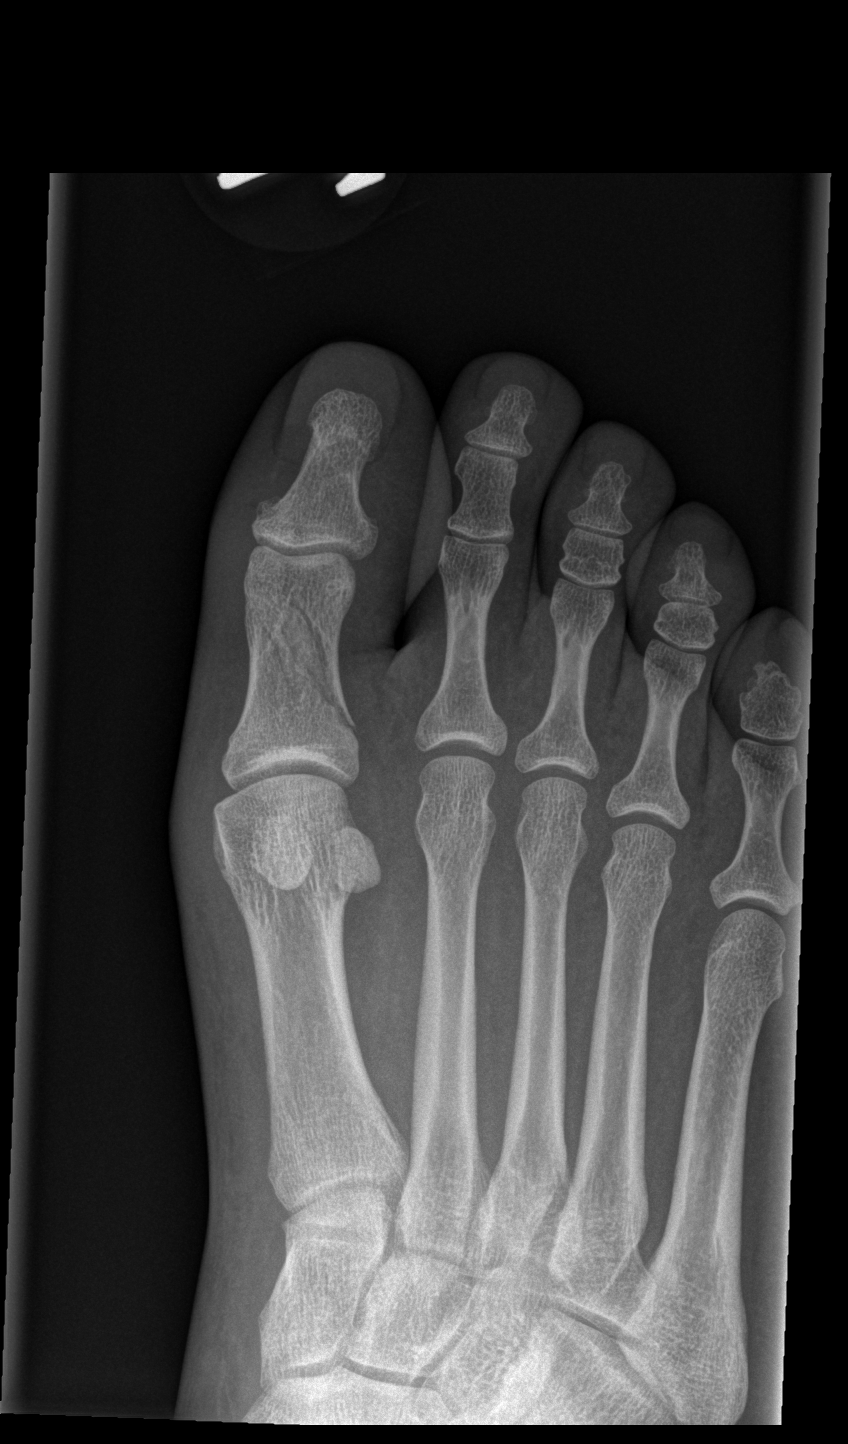

[x toes lat right]
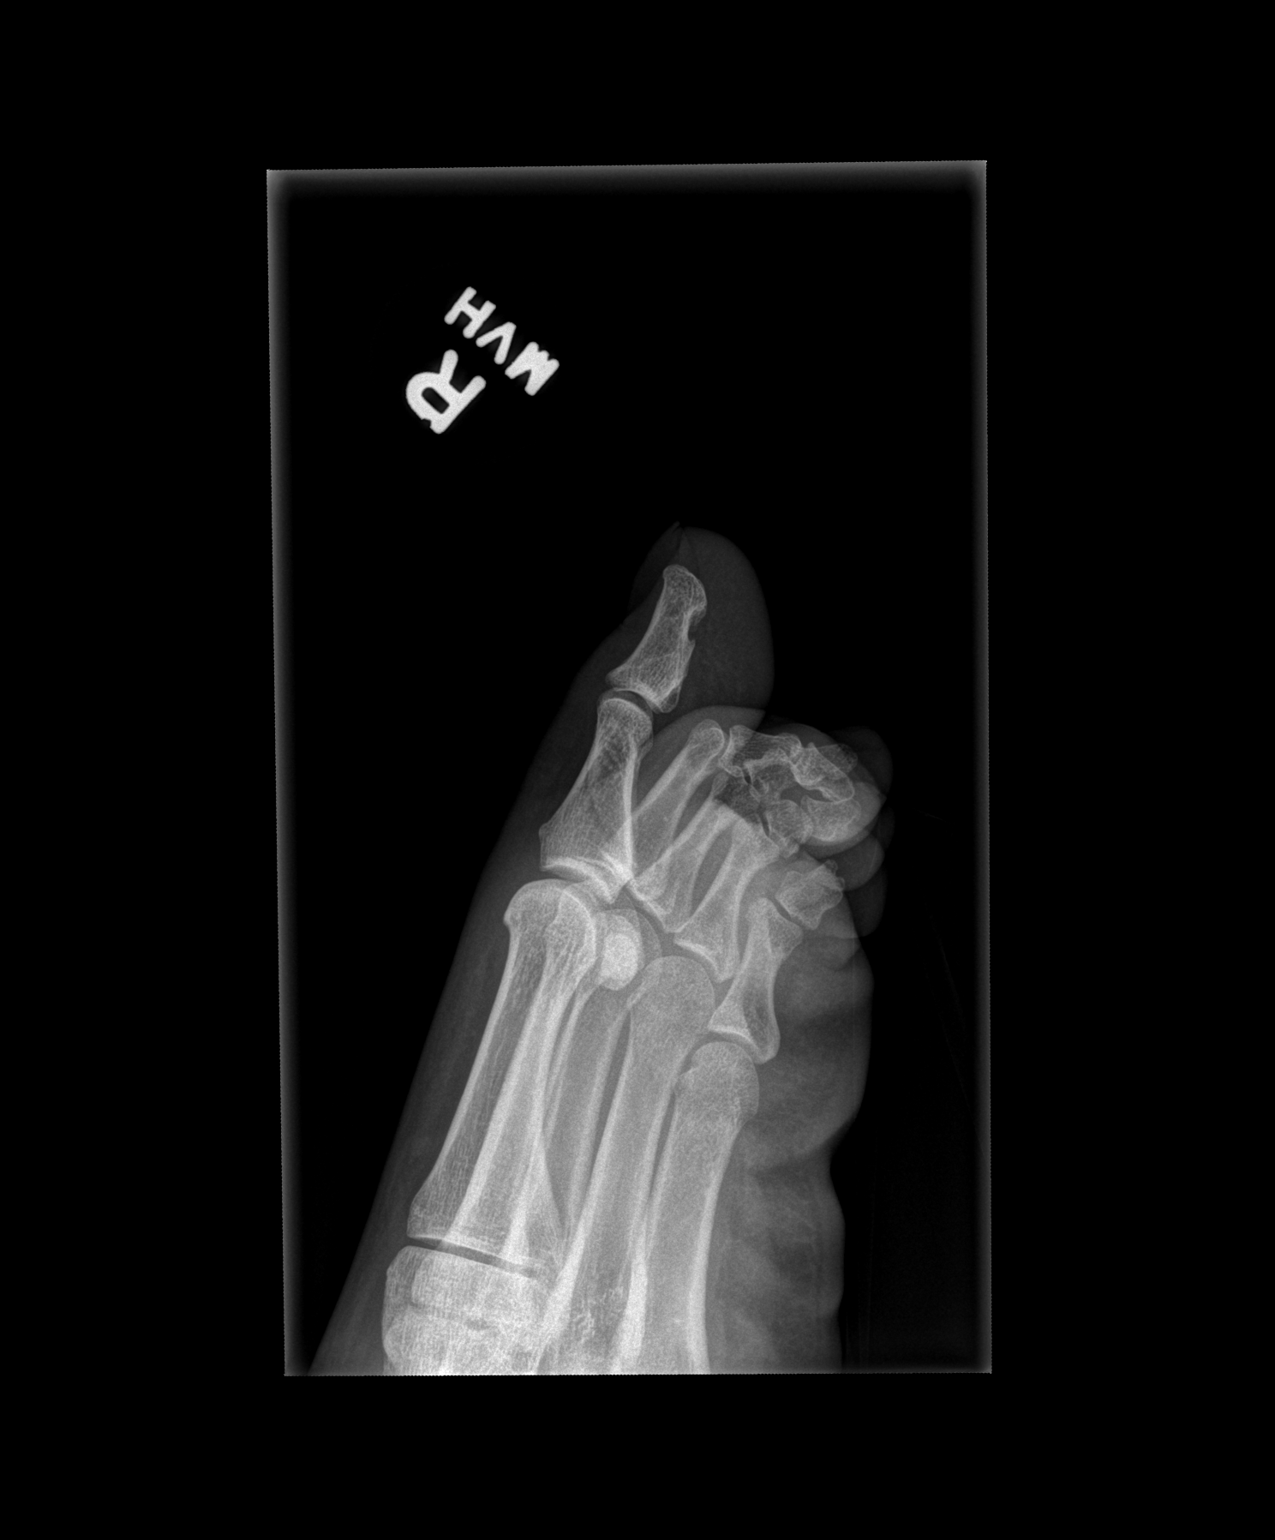

[3 of 3 positions shown; findings below may reference images not displayed]

FINDINGS: There is a nondisplaced complex oblique fracture of the proximal
phalanx of the great toe/first digit without definite intra-
articular extension.  Regional soft tissues are normal.  No
radiopaque foreign body.  Joint spaces are preserved.
IMPRESSION: Nondisplaced complex oblique fracture of the proximal phalanx of
the great toe without definite intra-articular extension.  No
radiopaque foreign body.

## 2014-11-27 ENCOUNTER — Encounter (HOSPITAL_COMMUNITY): Payer: Self-pay | Admitting: Emergency Medicine

## 2014-11-27 ENCOUNTER — Emergency Department (HOSPITAL_COMMUNITY)
Admission: EM | Admit: 2014-11-27 | Discharge: 2014-11-27 | Disposition: A | Discharge status: Home / Self Care | Payer: No Typology Code available for payment source | Attending: Emergency Medicine | Admitting: Emergency Medicine

## 2014-11-27 DIAGNOSIS — Z720 Tobacco use: Secondary | ICD-10-CM | POA: Insufficient documentation

## 2014-11-27 DIAGNOSIS — A599 Trichomoniasis, unspecified: Secondary | ICD-10-CM | POA: Insufficient documentation

## 2014-11-27 DIAGNOSIS — Z3202 Encounter for pregnancy test, result negative: Secondary | ICD-10-CM | POA: Insufficient documentation

## 2014-11-27 DIAGNOSIS — Z79899 Other long term (current) drug therapy: Secondary | ICD-10-CM | POA: Insufficient documentation

## 2014-11-27 DIAGNOSIS — N938 Other specified abnormal uterine and vaginal bleeding: Secondary | ICD-10-CM | POA: Insufficient documentation

## 2014-11-27 LAB — I-STAT CHEM 8, ED
BUN: 13 mg/dL (ref 6–20)
CHLORIDE: 105 mmol/L (ref 101–111)
Calcium, Ion: 1.25 mmol/L — ABNORMAL HIGH (ref 1.12–1.23)
Creatinine, Ser: 0.6 mg/dL (ref 0.44–1.00)
Glucose, Bld: 104 mg/dL — ABNORMAL HIGH (ref 65–99)
HCT: 37 % (ref 36.0–46.0)
Hemoglobin: 12.6 g/dL (ref 12.0–15.0)
POTASSIUM: 4 mmol/L (ref 3.5–5.1)
SODIUM: 137 mmol/L (ref 135–145)
TCO2: 21 mmol/L (ref 0–100)

## 2014-11-27 LAB — WET PREP, GENITAL: YEAST WET PREP: NONE SEEN

## 2014-11-27 LAB — HCG, QUANTITATIVE, PREGNANCY

## 2014-11-27 MED ORDER — METRONIDAZOLE 500 MG PO TABS
2000.0000 mg | ORAL_TABLET | Freq: Once | ORAL | Status: AC
  Administered 2014-11-27: 2000 mg via ORAL
  Filled 2014-11-27: qty 4

## 2014-11-27 MED ORDER — AZITHROMYCIN 250 MG PO TABS
1000.0000 mg | ORAL_TABLET | Freq: Once | ORAL | Status: AC
  Administered 2014-11-27: 1000 mg via ORAL
  Filled 2014-11-27: qty 4

## 2014-11-27 MED ORDER — ONDANSETRON 4 MG PO TBDP
4.0000 mg | ORAL_TABLET | Freq: Once | ORAL | Status: AC
  Administered 2014-11-27: 4 mg via ORAL
  Filled 2014-11-27: qty 1

## 2014-11-27 MED ORDER — CEFTRIAXONE SODIUM 250 MG IJ SOLR
250.0000 mg | Freq: Once | INTRAMUSCULAR | Status: AC
  Administered 2014-11-27: 250 mg via INTRAMUSCULAR
  Filled 2014-11-27: qty 250

## 2014-11-27 MED ORDER — STERILE WATER FOR INJECTION IJ SOLN
INTRAMUSCULAR | Status: AC
  Administered 2014-11-27: 10 mL
  Filled 2014-11-27: qty 10

## 2014-11-27 MED ORDER — ACETAMINOPHEN 325 MG PO TABS
650.0000 mg | ORAL_TABLET | Freq: Once | ORAL | Status: AC
  Administered 2014-11-27: 650 mg via ORAL
  Filled 2014-11-27: qty 2

## 2014-11-27 NOTE — Discharge Instructions (Signed)
Dysfunctional Uterine Bleeding °Dysfunctional uterine bleeding is abnormal bleeding from the uterus. Dysfunctional uterine bleeding includes: °· A period that comes earlier or later than usual. °· A period that is lighter, heavier, or has blood clots. °· Bleeding between periods. °· Skipping one or more periods. °· Bleeding after sexual intercourse. °· Bleeding after menopause. °HOME CARE INSTRUCTIONS  °Pay attention to any changes in your symptoms. Follow these instructions to help with your condition: °Eating °· Eat well-balanced meals. Include foods that are high in iron, such as liver, meat, shellfish, green leafy vegetables, and eggs. °· If you become constipated: °¨ Drink plenty of water. °¨ Eat fruits and vegetables that are high in water and fiber, such as spinach, carrots, raspberries, apples, and mango. °Medicines °· Take over-the-counter and prescription medicines only as told by your health care provider. °· Do not change medicines without talking with your health care provider. °· Aspirin or medicines that contain aspirin may make the bleeding worse. Do not take those medicines: °¨ During the week before your period. °¨ During your period. °· If you were prescribed iron pills, take them as told by your health care provider. Iron pills help to replace iron that your body loses because of this condition. °Activity °· If you need to change your sanitary pad or tampon more than one time every 2 hours: °¨ Lie in bed with your feet raised (elevated). °¨ Place a cold pack on your lower abdomen. °¨ Rest as much as possible until the bleeding stops or slows down. °· Do not try to lose weight until the bleeding has stopped and your blood iron level is back to normal. °Other Instructions °· For two months, write down: °¨ When your period starts. °¨ When your period ends. °¨ When any abnormal bleeding occurs. °¨ What problems you notice. °· Keep all follow up visits as told by your health care provider. This is  important. °SEEK MEDICAL CARE IF: °· You get light-headed or weak. °· You have nausea and vomiting. °· You cannot eat or drink without vomiting. °· You feel dizzy or have diarrhea while you are taking medicines. °· You are taking birth control pills or hormones, and you want to change them or stop taking them. °SEEK IMMEDIATE MEDICAL CARE IF: °· You develop a fever or chills. °· You need to change your sanitary pad or tampon more than one time per hour. °· Your bleeding becomes heavier, or your flow contains clots more often. °· You develop pain in your abdomen. °· You lose consciousness. °· You develop a rash. °  °This information is not intended to replace advice given to you by your health care provider. Make sure you discuss any questions you have with your health care provider. °  °Document Released: 01/08/2000 Document Revised: 10/01/2014 Document Reviewed: 04/07/2014 °Elsevier Interactive Patient Education ©2016 Elsevier Inc. °Trichomoniasis °Trichomoniasis is an infection caused by an organism called Trichomonas. The infection can affect both women and men. In women, the outer female genitalia and the vagina are affected. In men, the penis is mainly affected, but the prostate and other reproductive organs can also be involved. Trichomoniasis is a sexually transmitted infection (STI) and is most often passed to another person through sexual contact.  °RISK FACTORS °· Having unprotected sexual intercourse. °· Having sexual intercourse with an infected partner. °SIGNS AND SYMPTOMS  °Symptoms of trichomoniasis in women include: °· Abnormal gray-green frothy vaginal discharge. °· Itching and irritation of the vagina. °· Itching and irritation of   the area outside the vagina. Symptoms of trichomoniasis in men include:   Penile discharge with or without pain.  Pain during urination. This results from inflammation of the urethra. DIAGNOSIS  Trichomoniasis may be found during a Pap test or physical exam. Your  health care provider may use one of the following methods to help diagnose this infection:  Testing the pH of the vagina with a test tape.  Using a vaginal swab test that checks for the Trichomonas organism. A test is available that provides results within a few minutes.  Examining a urine sample.  Testing vaginal secretions. Your health care provider may test you for other STIs, including HIV. TREATMENT   You may be given medicine to fight the infection. Women should inform their health care provider if they could be or are pregnant. Some medicines used to treat the infection should not be taken during pregnancy.  Your health care provider may recommend over-the-counter medicines or creams to decrease itching or irritation.  Your sexual partner will need to be treated if infected.  Your health care provider may test you for infection again 3 months after treatment. HOME CARE INSTRUCTIONS   Take medicines only as directed by your health care provider.  Take over-the-counter medicine for itching or irritation as directed by your health care provider.  Do not have sexual intercourse while you have the infection.  Women should not douche or wear tampons while they have the infection.  Discuss your infection with your partner. Your partner may have gotten the infection from you, or you may have gotten it from your partner.  Have your sex partner get examined and treated if necessary.  Practice safe, informed, and protected sex.  See your health care provider for other STI testing. SEEK MEDICAL CARE IF:   You still have symptoms after you finish your medicine.  You develop abdominal pain.  You have pain when you urinate.  You have bleeding after sexual intercourse.  You develop a rash.  Your medicine makes you sick or makes you throw up (vomit). MAKE SURE YOU:  Understand these instructions.  Will watch your condition.  Will get help right away if you are not doing  well or get worse.   This information is not intended to replace advice given to you by your health care provider. Make sure you discuss any questions you have with your health care provider.   Document Released: 07/06/2000 Document Revised: 01/31/2014 Document Reviewed: 10/22/2012 Elsevier Interactive Patient Education [employer] Elsevier Inc.

## 2014-11-27 NOTE — ED Provider Notes (Signed)
CSN: [id number]     Arrival date & time 11/27/14  [id number] History   First MD Initiated Contact with Patient 11/27/14 [phone]     Chief Complaint  Patient presents with  . Abdominal Cramping  . Vaginal Bleeding     (Consider location/radiation/quality/duration/timing/severity/associated sxs/prior Treatment) HPI   PCP: No primary care provider on file. PMH: abnormal vaginal bleeding  Lisa Mack is a 34 y.o.  female  CHIEF COMPLAINT: Vaginal cramping and bleeding   Patient to the ER for large amount of bleeding with large clots for two days. [place] and has not been bleeding significantly since then. She reports having a period last month and again this morning. Her concerns are that the cramping is severe and she does not feel like she can go to work with the amount of bleeding that she is having. [facility] but has not called the clinic to schedule an appointment, she has not taken anything for pain. She denies feeling diaphoresis, fever, headache, weakness (general or focal), confusion, change of vision,  dysphagia, aphagia, shortness of breath,  abdominal pains, nausea, vomiting, diarrhea, lower extremity swelling, rash, neck pain, chest pain, dizziness.    Past Medical History  Diagnosis Date  . Abnormal vaginal bleeding    Past Surgical History  Procedure Laterality Date  . Vaginal delivery    . Hand surgery Left 1993   History reviewed. No pertinent family history. Social History  Substance Use Topics  . Smoking status: Current Every Day Smoker -- 0.50 packs/day    Types: Cigarettes  . Smokeless tobacco: None  . Alcohol Use: Yes     Comment: occas.   OB History    Gravida Para Term Preterm AB TAB SAB Ectopic Multiple Living   5 5 4 1      5      Review of Systems  10 Systems reviewed and are negative for acute change except as noted in the HPI.    Allergies  Review of patient's allergies indicates no  known allergies.  Home Medications   Prior to Admission medications   Medication Sig Start Date End Date Taking? Authorizing Provider  erythromycin ophthalmic ointment Place a 1/2 inch ribbon of ointment into the lower eyelid of both eyes four times a day for 5 days 01/31/14   [place]Victoria Creech, PA-C  ibuprofen (ADVIL,MOTRIN) 200 MG tablet Take 600 mg by mouth every 6 (six) hours as needed.    Historical Provider, MD  megestrol (MEGACE) 20 MG tablet Take 1 tablet (20 mg total) by mouth daily. 10/23/12   Duane LopeJennifer I Rasch, NP  naproxen sodium (ANAPROX) 220 MG tablet Take 220 mg by mouth 2 (two) times daily as needed (pain).    Historical Provider, MD  PARAGARD INTRAUTERINE COPPER IU 1 each by Intrauterine route continuous. Birth control. Copper IUD. 05/30/07   Historical Provider, MD  promethazine (PHENERGAN) 25 MG tablet Take 1 tablet (25 mg total) by mouth every 8 (eight) hours as needed for nausea or vomiting. 11/29/13   Christopher Lawyer, PA-C   BP 128/80 mmHg  Pulse 91  Temp(Src) 98.6 F (37 C) (Oral)  Resp 20  Ht 5\' 3"  (1.6 m)  Wt 155 lb (70.308 kg)  BMI 27.46 kg/m2  SpO2 96%  LMP 11/26/2014 (Exact Date) Physical Exam  Constitutional: She appears well-developed and well-nourished. No distress.  HENT:  Head: Normocephalic and atraumatic.  Eyes: Pupils are equal, round, and  reactive to light.  Neck: Normal range of motion. Neck supple.  Cardiovascular: Normal rate and regular rhythm.   Pulmonary/Chest: Effort normal.  Abdominal: Soft. Bowel sounds are normal. There is no tenderness. There is no rebound and no guarding.  Genitourinary: Uterus normal. There is no rash or tenderness on the right labia. There is no rash or tenderness on the left labia. Cervix exhibits no motion tenderness and no discharge. Right adnexum displays no mass and no tenderness. Left adnexum displays no mass and no tenderness. There is bleeding in the vagina.  Neurological: She is alert.  Skin: Skin is warm and  dry.  Nursing note and vitals reviewed.   ED Course  Procedures (including critical care time) Labs Review Labs Reviewed  WET PREP, GENITAL - Abnormal; Notable for the following:    Trich, Wet Prep FEW (*)    Clue Cells Wet Prep HPF POC FEW (*)    WBC, Wet Prep HPF POC FEW (*)    All other components within normal limits  I-STAT CHEM 8, ED - Abnormal; Notable for the following:    Glucose, Bld 104 (*)    Calcium, Ion 1.25 (*)    All other components within normal limits  HCG, QUANTITATIVE, PREGNANCY  GC/CHLAMYDIA PROBE AMP ([place]) NOT AT [facility]    Imaging Review No results found. I have personally reviewed and evaluated these images and lab results as part of my medical decision-making.   EKG Interpretation None      MDM   Final diagnoses:  Dysfunctional uterine bleeding  Trichimoniasis   Patient has few Trich on wet prep, few clue cells and few WBC, will treat with one time dose of Flagyl, Rocephin and Azithromycin.  Chem-8 shows stable hemoglobin at 12.6.   Patient will be given a work note as requested and referred to [facility] outpatient clinic to have IUD removed/replaced.  Medications  acetaminophen (TYLENOL) tablet 650 mg (650 mg Oral Given 11/27/14 0654)  azithromycin (ZITHROMAX) tablet 1,000 mg (1,000 mg Oral Given 11/27/14 0654)  ondansetron (ZOFRAN-ODT) disintegrating tablet 4 mg (4 mg Oral Given 11/27/14 0654)  cefTRIAXone (ROCEPHIN) injection 250 mg (250 mg Intramuscular Given 11/27/14 0654)  metroNIDAZOLE (FLAGYL) tablet 2,000 mg (2,000 mg Oral Given 11/27/14 0654)  sterile water (preservative free) injection (10 mLs  Given 11/27/14 0655)    34 y.o.Lisa Passe GMWNU'U medical screening exam was performed and I feel the patient has had an appropriate workup for their chief complaint at this time and likelihood of emergent condition existing is low. They have been counseled on decision, discharge, follow up and which symptoms necessitate immediate return to the  emergency department. They or their family verbally stated understanding and agreement with plan and discharged in stable condition.   Vital signs are stable at discharge. Filed Vitals:   11/27/14 0545  BP: 128/80  Pulse: 91  Temp: 98.6 F (37 C)  Resp: 20     Marlon Pel, PA-C 11/27/14 7253  Marlon Pel, PA-C 11/27/14 0725  Cy Blamer, MD 11/28/14 [phone]

## 2014-11-27 NOTE — ED Notes (Signed)
Patient states she is passing quarter to fifty cent sized blood clots and has abd cramping. Patient states she has an IUD.

## 2014-11-27 NOTE — ED Notes (Signed)
Pt given charge teaching, questions asked. V/s within normal range.

## 2014-11-28 LAB — GC/CHLAMYDIA PROBE AMP (~~LOC~~) NOT AT ARMC
Chlamydia: NEGATIVE
Neisseria Gonorrhea: NEGATIVE

## 2016-01-26 ENCOUNTER — Encounter (HOSPITAL_COMMUNITY): Payer: Self-pay | Admitting: Emergency Medicine

## 2016-01-26 ENCOUNTER — Emergency Department (HOSPITAL_COMMUNITY)
Admission: EM | Admit: 2016-01-26 | Discharge: 2016-01-26 | Disposition: A | Discharge status: Home / Self Care | Payer: No Typology Code available for payment source | Attending: Emergency Medicine | Admitting: Emergency Medicine

## 2016-01-26 DIAGNOSIS — F17210 Nicotine dependence, cigarettes, uncomplicated: Secondary | ICD-10-CM | POA: Insufficient documentation

## 2016-01-26 DIAGNOSIS — N939 Abnormal uterine and vaginal bleeding, unspecified: Secondary | ICD-10-CM | POA: Insufficient documentation

## 2016-01-26 DIAGNOSIS — R197 Diarrhea, unspecified: Secondary | ICD-10-CM | POA: Insufficient documentation

## 2016-01-26 LAB — CBC
HCT: 36.5 % (ref 36.0–46.0)
Hemoglobin: 12.3 g/dL (ref 12.0–15.0)
MCH: 29.4 pg (ref 26.0–34.0)
MCHC: 33.7 g/dL (ref 30.0–36.0)
MCV: 87.3 fL (ref 78.0–100.0)
PLATELETS: 205 10*3/uL (ref 150–400)
RBC: 4.18 MIL/uL (ref 3.87–5.11)
RDW: 13.4 % (ref 11.5–15.5)
WBC: 4.2 10*3/uL (ref 4.0–10.5)

## 2016-01-26 LAB — URINALYSIS, ROUTINE W REFLEX MICROSCOPIC
Bilirubin Urine: NEGATIVE
Glucose, UA: NEGATIVE mg/dL
Ketones, ur: 5 mg/dL — AB
LEUKOCYTES UA: NEGATIVE
NITRITE: NEGATIVE
Protein, ur: NEGATIVE mg/dL
SPECIFIC GRAVITY, URINE: 1.024 (ref 1.005–1.030)
pH: 5 (ref 5.0–8.0)

## 2016-01-26 LAB — COMPREHENSIVE METABOLIC PANEL
ALBUMIN: 4.2 g/dL (ref 3.5–5.0)
ALK PHOS: 40 U/L (ref 38–126)
ALT: 23 U/L (ref 14–54)
AST: 24 U/L (ref 15–41)
Anion gap: 6 (ref 5–15)
BILIRUBIN TOTAL: 0.6 mg/dL (ref 0.3–1.2)
BUN: 13 mg/dL (ref 6–20)
CO2: 21 mmol/L — ABNORMAL LOW (ref 22–32)
CREATININE: 0.54 mg/dL (ref 0.44–1.00)
Calcium: 9.2 mg/dL (ref 8.9–10.3)
Chloride: 111 mmol/L (ref 101–111)
GFR calc Af Amer: >60 mL/min (ref >60)
GFR calc non Af Amer: >60 mL/min (ref >60)
Glucose, Bld: 79 mg/dL (ref 65–99)
Potassium: 4.2 mmol/L (ref 3.5–5.1)
Sodium: 138 mmol/L (ref 135–145)
Total Protein: 7.2 g/dL (ref 6.5–8.1)

## 2016-01-26 LAB — I-STAT BETA HCG BLOOD, ED (MC, WL, AP ONLY): I-stat hCG, quantitative: <5 m[IU]/mL (ref <5)

## 2016-01-26 LAB — LIPASE, BLOOD: Lipase: 21 U/L (ref 11–51)

## 2016-01-26 NOTE — ED Triage Notes (Signed)
Pt c/o cough, abdominal cramping, diarrhea onset 3 am, and irregular vaginal bleeding, believes to do with IUD. About 5 episodes of diarrhea. No N/V.

## 2016-01-26 NOTE — ED Provider Notes (Signed)
[facility] Provider Note   CSN: [id number] Arrival date & time: 01/26/16  1352     History   Chief Complaint Chief Complaint  Patient presents with  . Vaginal Bleeding  . Diarrhea    HPI Lisa Mack is a 36 y.o. female.  HPI Patient presents with some diarrhea abdominal cramping and cough since last night. She kind of aches all over. No nausea or vomiting. Some crampy abdominal pain. She's had irregular vaginal bleeding. She had a normal menses and then has had some other spotty bleeding. Last menses was 2 months ago. She has a copper IUD in place that she has had an around 8 years. She states that last 10 years. No fevers or chills. No vaginal discharge besides the menses and spotting. Denies possibility STD. She had previously had trichomonas but is no longer with her partner and has had a test of clearance.   Past Medical History:  Diagnosis Date  . Abnormal vaginal bleeding     There are no active problems to display for this patient.   Past Surgical History:  Procedure Laterality Date  . hand surgery Left 1993  . VAGINAL DELIVERY      OB History    Gravida Para Term Preterm AB Living   5 5 4 1   5    SAB TAB Ectopic Multiple Live Births                   Home Medications    Prior to Admission medications   Medication Sig Start Date End Date Taking? Authorizing Provider  acetaminophen (TYLENOL) 500 MG tablet Take 1,000 mg by mouth every 6 (six) hours as needed for moderate pain.   Yes Historical Provider, MD  erythromycin ophthalmic ointment Place a 1/2 inch ribbon of ointment into the lower eyelid of both eyes four times a day for 5 days Patient not taking: Reported on 01/26/2016 01/31/14   Oswaldo ConroyVictoria Creech, PA-C  ibuprofen (ADVIL,MOTRIN) 200 MG tablet Take 600 mg by mouth every 6 (six) hours as needed.    Historical Provider, MD  megestrol (MEGACE) 20 MG tablet Take 1 tablet (20 mg total) by mouth daily. Patient not taking: Reported on 01/26/2016  10/23/12   Duane LopeJennifer I Rasch, NP  naproxen sodium (ANAPROX) 220 MG tablet Take 220 mg by mouth 2 (two) times daily as needed (pain).    Historical Provider, MD  PARAGARD INTRAUTERINE COPPER IU 1 each by Intrauterine route continuous. Birth control. Copper IUD. 05/30/07   Historical Provider, MD  promethazine (PHENERGAN) 25 MG tablet Take 1 tablet (25 mg total) by mouth every 8 (eight) hours as needed for nausea or vomiting. Patient not taking: Reported on 01/26/2016 11/29/13   Charlestine Nighthristopher Lawyer, PA-C    Family History No family history on file.  Social History Social History  Substance Use Topics  . Smoking status: Current Every Day Smoker    Packs/day: 0.50    Types: Cigarettes  . Smokeless tobacco: Not on file  . Alcohol use Yes     Comment: occas.     Allergies   Patient has no known allergies.   Review of Systems Review of Systems  Constitutional: Negative for appetite change.  HENT: Negative for congestion.   Respiratory: Negative for chest tightness.   Cardiovascular: Negative for chest pain.  Gastrointestinal: Positive for diarrhea.  Genitourinary: Positive for vaginal bleeding. Negative for decreased urine volume, difficulty urinating, dyspareunia and vaginal discharge.  Musculoskeletal: Negative for arthralgias.  Skin: Negative  for color change.  Neurological: Negative for headaches.  Hematological: Does not bruise/bleed easily.  Pelvic exam showed some mild vaginal bleeding. IUD is in place and there is around a half centimeter of plastic visible along with the strings.   Physical Exam Updated Vital Signs BP 127/70 (BP Location: Left Arm)   Pulse 86   Temp 98.4 F (36.9 C) (Oral)   Resp 18   Wt 174 lb (78.9 kg)   LMP 11/25/2015   SpO2 99%   BMI 30.82 kg/m   Physical Exam  Constitutional: She appears well-developed.  HENT:  Head: Atraumatic.  Eyes: EOM are normal.  Neck: Neck supple.  Cardiovascular: Normal rate.   Pulmonary/Chest: Effort normal.    Abdominal: There is tenderness.  Mild lower abdominal tenderness without rebound guarding or mass.  Musculoskeletal: She exhibits no tenderness.  Neurological: She is alert.  Skin: Skin is warm. Capillary refill takes less than 2 seconds.     ED Treatments / Results  Labs (all labs ordered are listed, but only abnormal results are displayed) Labs Reviewed  COMPREHENSIVE METABOLIC PANEL - Abnormal; Notable for the following:       Result Value   CO2 21 (*)    All other components within normal limits  URINALYSIS, ROUTINE W REFLEX MICROSCOPIC - Abnormal; Notable for the following:    Hgb urine dipstick SMALL (*)    Ketones, ur 5 (*)    Bacteria, UA RARE (*)    Squamous Epithelial / LPF 0-5 (*)    All other components within normal limits  LIPASE, BLOOD  CBC  I-STAT BETA HCG BLOOD, ED ([facility], [facility], [facility] ONLY)    EKG  EKG Interpretation None       Radiology No results found.  Procedures Procedures (including critical care time)  Medications Ordered in ED Medications - No data to display   Initial Impression / Assessment and Plan / ED Course  I have reviewed the triage vital signs and the nursing notes.  Pertinent labs & imaging results that were available during my care of the patient were reviewed by me and considered in my medical decision making (see chart for details).  Clinical Course     Patient with diarrhea. Also having some vaginal bleeding. Well-appearing. Doubt severe abnormality with IUD. Does not appear to have infection area will discharge home to follow-up with OB/GYN. Appears well-hydrated.  Final Clinical Impressions(s) / ED Diagnoses   Final diagnoses:  Vaginal bleeding  Diarrhea, unspecified type    New Prescriptions New Prescriptions   No medications on file     Benjiman Core, MD 01/26/16 1927

## 2016-01-26 NOTE — Discharge Instructions (Signed)
Follow-up with your OB/GYN as needed.

## 2016-03-30 ENCOUNTER — Emergency Department (HOSPITAL_COMMUNITY)
Admission: EM | Admit: 2016-03-30 | Discharge: 2016-03-30 | Disposition: A | Discharge status: Home / Self Care | Payer: No Typology Code available for payment source | Attending: Emergency Medicine | Admitting: Emergency Medicine

## 2016-03-30 ENCOUNTER — Encounter (HOSPITAL_COMMUNITY): Payer: Self-pay | Admitting: Emergency Medicine

## 2016-03-30 DIAGNOSIS — L723 Sebaceous cyst: Secondary | ICD-10-CM

## 2016-03-30 DIAGNOSIS — H6002 Abscess of left external ear: Secondary | ICD-10-CM | POA: Insufficient documentation

## 2016-03-30 DIAGNOSIS — F17210 Nicotine dependence, cigarettes, uncomplicated: Secondary | ICD-10-CM | POA: Insufficient documentation

## 2016-03-30 MED ORDER — LIDOCAINE-EPINEPHRINE (PF) 2 %-1:200000 IJ SOLN
20.0000 mL | Freq: Once | INTRAMUSCULAR | Status: AC
  Administered 2016-03-30: 20 mL
  Filled 2016-03-30: qty 20

## 2016-03-30 NOTE — ED Provider Notes (Signed)
[facility] Provider Note   CSN: [id number] Arrival date & time: 03/30/16  [id number]     History   Chief Complaint Chief Complaint  Patient presents with  . Abscess    left ear    HPI Lisa KailLatoshia N Mack is a 36 y.o. female.  Patient presents with complaint of swelling in front of her left ear which is become more painful and more swollen over the past several weeks. No drainage. No fevers or vomiting. No treatments prior to arrival. No history of abscess. The onset of this condition was gradual. Aggravating factors: none. Alleviating factors: none.        Past Medical History:  Diagnosis Date  . Abnormal vaginal bleeding     There are no active problems to display for this patient.   Past Surgical History:  Procedure Laterality Date  . hand surgery Left 1993  . VAGINAL DELIVERY      OB History    Gravida Para Term Preterm AB Living   5 5 4 1   5    SAB TAB Ectopic Multiple Live Births                   Home Medications    Prior to Admission medications   Medication Sig Start Date End Date Taking? Authorizing Provider  acetaminophen (TYLENOL) 500 MG tablet Take 1,000 mg by mouth every 6 (six) hours as needed for moderate pain.    Historical Provider, MD  erythromycin ophthalmic ointment Place a 1/2 inch ribbon of ointment into the lower eyelid of both eyes four times a day for 5 days Patient not taking: Reported on 01/26/2016 01/31/14   Oswaldo ConroyVictoria Creech, PA-C  ibuprofen (ADVIL,MOTRIN) 200 MG tablet Take 600 mg by mouth every 6 (six) hours as needed.    Historical Provider, MD  megestrol (MEGACE) 20 MG tablet Take 1 tablet (20 mg total) by mouth daily. Patient not taking: Reported on 01/26/2016 10/23/12   Duane LopeJennifer I Rasch, NP  naproxen sodium (ANAPROX) 220 MG tablet Take 220 mg by mouth 2 (two) times daily as needed (pain).    Historical Provider, MD  PARAGARD INTRAUTERINE COPPER IU 1 each by Intrauterine route continuous. Birth control. Copper IUD. 05/30/07    Historical Provider, MD  promethazine (PHENERGAN) 25 MG tablet Take 1 tablet (25 mg total) by mouth every 8 (eight) hours as needed for nausea or vomiting. Patient not taking: Reported on 01/26/2016 11/29/13   Charlestine Nighthristopher Lawyer, PA-C    Family History History reviewed. No pertinent family history.  Social History Social History  Substance Use Topics  . Smoking status: Current Every Day Smoker    Packs/day: 0.50    Types: Cigarettes  . Smokeless tobacco: Never Used  . Alcohol use Yes     Comment: occas.     Allergies   Patient has no known allergies.   Review of Systems Review of Systems  Constitutional: Negative for fever.  Gastrointestinal: Negative for nausea and vomiting.  Skin: Negative for color change.       Positive for swelling  Hematological: Negative for adenopathy.     Physical Exam Updated Vital Signs BP 109/65   Pulse 84   Temp 98.3 F (36.8 C) (Oral)   Resp 16   LMP 03/10/2016   SpO2 98%   Physical Exam  Constitutional: She appears well-developed and well-nourished.  HENT:  Head: Normocephalic and atraumatic.  Eyes: Conjunctivae are normal.  Neck: Normal range of motion. Neck supple.  Pulmonary/Chest: No respiratory  distress.  Neurological: She is alert.  Skin: Skin is warm and dry.  2-3 cm soft nodule noted anterior to the left ear. Likely a cyst. No drainage. Area is moderately tender.  Psychiatric: She has a normal mood and affect.  Nursing note and vitals reviewed.    ED Treatments / Results   Procedures Procedures (including critical care time)  Medications Ordered in ED Medications  lidocaine-EPINEPHrine (XYLOCAINE W/EPI) 2 %-1:200000 (PF) injection 20 mL (20 mLs Infiltration Given by Other 03/30/16 0844)     Initial Impression / Assessment and Plan / ED Course  I have reviewed the triage vital signs and the nursing notes.  Pertinent labs & imaging results that were available during my care of the patient were reviewed by me and  considered in my medical decision making (see chart for details).     Patient seen and examined. [place] used. Appears to be cystic as opposed to lymph node. Will needle aspirate.   Vital signs reviewed and are as follows: BP 109/65   Pulse 84   Temp 98.3 F (36.8 C) (Oral)   Resp 16   LMP 03/10/2016   SpO2 98%   INCISION AND DRAINAGE Performed by: Phylliss Blakes PA-S2 Consent: Verbal consent obtained. Risks and benefits: risks, benefits and alternatives were discussed Type: abscess  Body area: left face  Anesthesia: local infiltration  Incision was made with a scalpel.  Local anesthetic: lidocaine 2% with epinephrine  Anesthetic total: 3 ml  Complexity: complex Blunt dissection to break up loculations  Drainage: purulent  Drainage amount: large  Packing material: none  Patient tolerance: Patient tolerated the procedure well with no immediate complications.  9:30 AM The patient was urged to return to the Emergency Department urgently with worsening pain, swelling, expanding erythema especially if it streaks away from the affected area, fever, or if they have any other concerns.   The patient was urged to return to the Emergency Department or go to their PCP in 48 hours for wound recheck if the area is not significantly improved.  The patient verbalized understanding and stated agreement with this plan.      Final Clinical Impressions(s) / ED Diagnoses   Final diagnoses:  Inflamed sebaceous cyst   Patient with inflamed sebaceous cyst. Needle aspiration attempted but I&D was ultimately performed making a small 2-76mm incision in skin to express copious amounts of purulent and waxy material.   New Prescriptions New Prescriptions   No medications on file     Renne Crigler, PA-C 03/30/16 0932    Charlynne Pander, MD 03/30/16 [phone]

## 2016-03-30 NOTE — ED Notes (Signed)
Bed: WA22 Expected date:  Expected time:  Means of arrival:  Comments: 

## 2016-03-30 NOTE — Discharge Instructions (Signed)
Please read and follow all provided instructions.  Your diagnoses today include:  1. Inflamed sebaceous cyst     Tests performed today include:  Vital signs. See below for your results today.   Medications prescribed:   None  Take any prescribed medications only as directed.   Home care instructions:   Follow any educational materials contained in this packet  Follow-up instructions: Return to the Emergency Department in 48 hours for a recheck if your symptoms are not significantly improved.  Return instructions:  Return to the Emergency Department if you have:  Fever  Worsening symptoms  Worsening pain  Worsening swelling  Redness of the skin that moves away from the affected area, especially if it streaks away from the affected area   Any other emergent concerns  Your vital signs today were: BP 109/65    Pulse 84    Temp 98.3 F (36.8 C) (Oral)    Resp 16    LMP 03/10/2016    SpO2 98%  If your blood pressure (BP) was elevated above 135/85 this visit, please have this repeated by your doctor within one month. --------------

## 2016-03-30 NOTE — ED Triage Notes (Signed)
Pt reports abscess near left ear progressed over that last several months and is now very painful

## 2016-09-26 ENCOUNTER — Encounter (HOSPITAL_COMMUNITY): Payer: Self-pay | Admitting: *Deleted

## 2016-09-26 ENCOUNTER — Emergency Department (HOSPITAL_COMMUNITY)
Admission: EM | Admit: 2016-09-26 | Discharge: 2016-09-26 | Disposition: A | Discharge status: Home / Self Care | Payer: No Typology Code available for payment source | Attending: Emergency Medicine | Admitting: Emergency Medicine

## 2016-09-26 DIAGNOSIS — F17210 Nicotine dependence, cigarettes, uncomplicated: Secondary | ICD-10-CM | POA: Insufficient documentation

## 2016-09-26 DIAGNOSIS — B850 Pediculosis due to Pediculus humanus capitis: Secondary | ICD-10-CM | POA: Insufficient documentation

## 2016-09-26 MED ORDER — MALATHION 0.5 % EX LOTN: TOPICAL_LOTION | Freq: Once | CUTANEOUS | 0 refills | Status: AC

## 2016-09-26 NOTE — ED Notes (Signed)
ED Provider at bedside. 

## 2016-09-26 NOTE — ED Provider Notes (Signed)
[facility] Provider Note   CSN: [id number] Arrival date & time: 09/26/16  0600     History   Chief Complaint Chief Complaint  Patient presents with  . Foreign Body in Ear    HPI Lisa Mack is a 36 y.o. female.  The history is provided by the patient. No language interpreter was used.  Rash   This is a new problem. The current episode started more than 2 days ago. The problem has been gradually worsening. The problem is associated with nothing. There has been no fever. The rash is present on the scalp. The pain is moderate. The pain has been constant since onset. She has tried nothing for the symptoms. The treatment provided no relief.  Pt complains of her scalp itching, Pt reports rash on skin.    Past Medical History:  Diagnosis Date  . Abnormal vaginal bleeding     There are no active problems to display for this patient.   Past Surgical History:  Procedure Laterality Date  . hand surgery Left 1993  . VAGINAL DELIVERY      OB History    Gravida Para Term Preterm AB Living   5 5 4 1   5    SAB TAB Ectopic Multiple Live Births                   Home Medications    Prior to Admission medications   Medication Sig Start Date End Date Taking? Authorizing Provider  PARAGARD INTRAUTERINE COPPER IU 1 each by Intrauterine route continuous. Birth control. Copper IUD.  Placed 06/21/2007 05/30/07   [provider]    Family History History reviewed. No pertinent family history.  Social History Social History  Substance Use Topics  . Smoking status: Current Every Day Smoker    Packs/day: 0.50    Types: Cigarettes  . Smokeless tobacco: Never Used  . Alcohol use Yes     Comment: occas.     Allergies   Patient has no known allergies.   Review of Systems Review of Systems  Skin: Positive for rash.  All other systems reviewed and are negative.    Physical Exam Updated Vital Signs BP 129/83 (BP Location: Left Arm)   Pulse 68   Temp  98.2 F (36.8 C)   Resp 18   Ht 5\' 3"  (1.6 m)   Wt 65.8 kg (145 lb)   LMP 09/26/2016   SpO2 95%   BMI 25.69 kg/m   Physical Exam  Constitutional: She is oriented to person, place, and time. She appears well-developed and well-nourished. No distress.  HENT:  Head: Normocephalic and atraumatic.  Eyes: Conjunctivae and EOM are normal.  Neck: Normal range of motion. Neck supple.  Cardiovascular: Normal rate.   No murmur heard. Pulmonary/Chest: Effort normal. No respiratory distress.  Abdominal: Soft. She exhibits no distension. There is no tenderness.  Musculoskeletal: Normal range of motion. She exhibits no edema.  Neurological: She is alert and oriented to person, place, and time.  Skin:  Few scattered small raised areas,  Hair nits lower hairline  Psychiatric: She has a normal mood and affect.  Nursing note and vitals reviewed.    ED Treatments / Results  Labs (all labs ordered are listed, but only abnormal results are displayed) Labs Reviewed - No data to display  EKG  EKG Interpretation None       Radiology No results found.  Procedures Procedures (including critical care time)  Medications Ordered in ED Medications -  No data to display   Initial Impression / Assessment and Plan / ED Course  I have reviewed the triage vital signs and the nursing notes.  Pertinent labs & imaging results that were available during my care of the patient were reviewed by me and considered in my medical decision making (see chart for details).     Meds ordered this encounter  Medications  . malathion (OVIDE) 0.5 % lotion    Sig: Apply topically once. Sprinkle lotion on dry hair and rub gently until the scalp is thoroughly moistened. Allow to dry naturally and leave uncovered.    Dispense:  59 mL    Refill:  0    Order Specific Question:   Supervising Provider    Answer:   Eber HongMILLER, BRIAN [[id number]]    Final Clinical Impressions(s) / ED Diagnoses   Final diagnoses:  Head  lice    New Prescriptions New Prescriptions   No medications on file     Osie CheeksSofia, Niala Stcharles K, PA-C 09/26/16 [id number]    Azalia Bilisampos, Kevin, MD 09/27/16 [phone]

## 2016-09-26 NOTE — ED Triage Notes (Signed)
Patient is alert and oriented x4.  She is being seen for right ear discomfort.  Patient states that she is having issues at home and has called her land lord and the city to get her apartment checked out for termites.  Patient wanted to get checked out cause she thinks something might have gotten in her ears.  Currently she denies any pain, only discomfort.

## 2016-09-26 NOTE — ED Notes (Signed)
Bed: WA04 Expected date:  Expected time:  Means of arrival:  Comments: 

## 2018-04-09 ENCOUNTER — Encounter (HOSPITAL_COMMUNITY): Payer: Self-pay | Admitting: Emergency Medicine

## 2018-04-09 ENCOUNTER — Ambulatory Visit (HOSPITAL_COMMUNITY)
Admission: EM | Admit: 2018-04-09 | Discharge: 2018-04-09 | Disposition: A | Discharge status: Home / Self Care | Payer: BLUE CROSS/BLUE SHIELD

## 2018-04-09 ENCOUNTER — Other Ambulatory Visit: Payer: Self-pay

## 2018-04-09 DIAGNOSIS — J069 Acute upper respiratory infection, unspecified: Secondary | ICD-10-CM | POA: Diagnosis not present

## 2018-04-09 NOTE — ED Triage Notes (Addendum)
4 days ago started coughing, sore throat, diarrhea, mild headache and nausea, no vomiting.  Patient had episodes of sweating  Patient requests not to go to work.

## 2018-04-09 NOTE — ED Provider Notes (Signed)
[facility]    CSN: [id number] Arrival date & time: 04/09/18  1355     History   Chief Complaint Chief Complaint  Patient presents with  . URI  . Letter for School/Work    HPI Lisa Mack is a 38 y.o. female.   The history is provided by the patient. No language interpreter was used.  URI  Presenting symptoms: congestion and cough   Severity:  Moderate Onset quality:  Gradual Duration:  4 days Timing:  Constant Progression:  Worsening Chronicity:  New Relieved by:  Nothing Associated symptoms: no headaches   Risk factors: no sick contacts   Pt reports she works at [employer].  Pt reports she has had a cough.  Pt reports no fever.  Pt feels well.  Pt was told she needs a note in order to return to work.   Past Medical History:  Diagnosis Date  . Abnormal vaginal bleeding     There are no active problems to display for this patient.   Past Surgical History:  Procedure Laterality Date  . hand surgery Left 1993  . VAGINAL DELIVERY      OB History    Gravida  5   Para  5   Term  4   Preterm  1   AB      Living  5     SAB      TAB      Ectopic      Multiple      Live Births               Home Medications    Prior to Admission medications   Medication Sig Start Date End Date Taking? Authorizing Provider  PARAGARD INTRAUTERINE COPPER IU 1 each by Intrauterine route continuous. Birth control. Copper IUD.  Placed 06/21/2007 05/30/07   [provider]    Family History Family History  Problem Relation Age of Onset  . Healthy Father     Social History Social History   Tobacco Use  . Smoking status: Current Every Day Smoker    Packs/day: 0.50    Types: Cigarettes  . Smokeless tobacco: Never Used  Substance Use Topics  . Alcohol use: Yes    Comment: occas.  . Drug use: No     Allergies   Patient has no known allergies.   Review of Systems Review of Systems  HENT: Positive for congestion.    Respiratory: Positive for cough.   Neurological: Negative for headaches.  All other systems reviewed and are negative.    Physical Exam Triage Vital Signs ED Triage Vitals  Enc Vitals Group     BP 04/09/18 1455 (!) 135/93     Pulse Rate 04/09/18 1455 87     Resp 04/09/18 1455 18     Temp 04/09/18 1455 97.7 F (36.5 C)     Temp Source 04/09/18 1455 Temporal     SpO2 04/09/18 1455 100 %     Weight --      Height --      Head Circumference --      Peak Flow --      Pain Score 04/09/18 1451 5     Pain Loc --      Pain Edu? --      Excl. in GC? --    No data found.  Updated Vital Signs BP (!) 135/93 (BP Location: Right Arm)   Pulse 87   Temp 97.7 F (36.5 C) (  Temporal)   Resp 18   LMP 04/02/2018   SpO2 100%   Visual Acuity Right Eye Distance:   Left Eye Distance:   Bilateral Distance:    Right Eye Near:   Left Eye Near:    Bilateral Near:     Physical Exam Vitals signs and nursing note reviewed.  Constitutional:      Appearance: She is well-developed.  HENT:     Head: Normocephalic and atraumatic.     Right Ear: Tympanic membrane normal.     Left Ear: Tympanic membrane normal.     Nose: Nose normal.     Mouth/Throat:     Mouth: Mucous membranes are moist.  Eyes:     Pupils: Pupils are equal, round, and reactive to light.  Neck:     Musculoskeletal: Normal range of motion.  Cardiovascular:     Rate and Rhythm: Normal rate.  Pulmonary:     Effort: Pulmonary effort is normal.  Abdominal:     General: Abdomen is flat. There is no distension.  Musculoskeletal: Normal range of motion.  Skin:    General: Skin is warm.  Neurological:     Mental Status: She is alert and oriented to person, place, and time.  Psychiatric:        Mood and Affect: Mood normal.      UC Treatments / Results  Labs (all labs ordered are listed, but only abnormal results are displayed) Labs Reviewed - No data to display  EKG None  Radiology No results found.   Procedures Procedures (including critical care time)  Medications Ordered in UC Medications - No data to display  Initial Impression / Assessment and Plan / UC Course  I have reviewed the triage vital signs and the nursing notes.  Pertinent labs & imaging results that were available during my care of the patient were reviewed by me and considered in my medical decision making (see chart for details).     MDM   Pt looks well.   I advised if afebrile for 24 hours I  Feel she can return to work.  Pt given note to stay out today and tomorrow.   Final Clinical Impressions(s) / UC Diagnoses   Final diagnoses:  Upper respiratory tract infection, unspecified type   Discharge Instructions   None    ED Prescriptions    None     Controlled Substance Prescriptions [place] Controlled Substance Registry consulted? Not Applicable   Elson Areas, [place] 04/09/18 1725

## 2019-09-25 ENCOUNTER — Other Ambulatory Visit: Payer: Self-pay | Admitting: Obstetrics and Gynecology

## 2019-09-25 DIAGNOSIS — R928 Other abnormal and inconclusive findings on diagnostic imaging of breast: Secondary | ICD-10-CM

## 2019-10-18 ENCOUNTER — Other Ambulatory Visit: Payer: No Typology Code available for payment source

## 2020-01-10 ENCOUNTER — Ambulatory Visit: Payer: No Typology Code available for payment source | Admitting: Family Medicine

## 2020-03-20 ENCOUNTER — Ambulatory Visit: Payer: No Typology Code available for payment source | Admitting: Family Medicine

## 2020-03-20 DIAGNOSIS — Z0289 Encounter for other administrative examinations: Secondary | ICD-10-CM

## 2020-03-26 ENCOUNTER — Ambulatory Visit (HOSPITAL_COMMUNITY): Payer: Self-pay

## 2020-07-17 ENCOUNTER — Ambulatory Visit (HOSPITAL_COMMUNITY)
Admission: EM | Admit: 2020-07-17 | Discharge: 2020-07-17 | Disposition: A | Discharge status: Home / Self Care | Payer: 59 | Attending: Medical Oncology | Admitting: Medical Oncology

## 2020-07-17 ENCOUNTER — Other Ambulatory Visit: Payer: Self-pay

## 2020-07-17 ENCOUNTER — Encounter (HOSPITAL_COMMUNITY): Payer: Self-pay

## 2020-07-17 DIAGNOSIS — J029 Acute pharyngitis, unspecified: Secondary | ICD-10-CM | POA: Diagnosis not present

## 2020-07-17 DIAGNOSIS — F17210 Nicotine dependence, cigarettes, uncomplicated: Secondary | ICD-10-CM | POA: Insufficient documentation

## 2020-07-17 DIAGNOSIS — Z20822 Contact with and (suspected) exposure to covid-19: Secondary | ICD-10-CM | POA: Diagnosis not present

## 2020-07-17 DIAGNOSIS — M5431 Sciatica, right side: Secondary | ICD-10-CM | POA: Diagnosis present

## 2020-07-17 LAB — SARS CORONAVIRUS 2 (TAT 6-24 HRS): SARS Coronavirus 2: NEGATIVE

## 2020-07-17 LAB — POCT RAPID STREP A, ED / UC: Streptococcus, Group A Screen (Direct): NEGATIVE

## 2020-07-17 MED ORDER — IBUPROFEN 800 MG PO TABS: 800.0000 mg | ORAL_TABLET | Freq: Three times a day (TID) | ORAL | 0 refills | Status: DC

## 2020-07-17 NOTE — ED Triage Notes (Signed)
Patient c/o pain in right hip to tailbone (pain 5/10). Patient trying cold compresses with some improvement. Pain began last Saturday and is sharp. Patient states she doesn't know why the pain started. Ambulates to room with steady gait.   Pt c/o pain to throat, not able to drink or eat well without pain, tried HALLs with no success for relief. Pain started around 5:30pm yesterday, throat pain is sore.

## 2020-07-17 NOTE — ED Provider Notes (Addendum)
[facility]    CSN: [id number] Arrival date & time: 07/17/20  0800      History   Chief Complaint Chief Complaint  Patient presents with   Leg Pain   throat pain    HPI MARLEY PAKULA is a 40 y.o. female.   HPI  Hip Pain: Patient states that she is having some pain in her right hip area.  The pain radiates down to her buttock and then occasionally into her inner thigh area.  Pain rated 5 out of 10 in nature and worse with standing or sitting for long periods of time.  Slightly improved as of today.  She has no known injury and is unsure why this discomfort has started.  No troubles with walking, neuro weakness and no incontinence.   Sore Throat: Patient states that starting yesterday she developed a sore throat along with mild headache and body aches.  She has tried cough drops without much relief.  She denies any vomiting, significant cough, abdominal pain.  No known sick contacts.  Past Medical History:  Diagnosis Date   Abnormal vaginal bleeding     There are no problems to display for this patient.   Past Surgical History:  Procedure Laterality Date   hand surgery Left 1993   VAGINAL DELIVERY      OB History     Gravida  5   Para  5   Term  4   Preterm  1   AB      Living  5      SAB      IAB      Ectopic      Multiple      Live Births               Home Medications    Prior to Admission medications   Medication Sig Start Date End Date Taking? Authorizing Provider  PARAGARD INTRAUTERINE COPPER IU 1 each by Intrauterine route continuous. Birth control. Copper IUD.  Placed 06/21/2007 05/30/07   [provider]    Family History Family History  Problem Relation Age of Onset   Healthy Father     Social History Social History   Tobacco Use   Smoking status: Every Day    Packs/day: 0.50    Pack years: 0.00    Types: Cigarettes   Smokeless tobacco: Never  Vaping Use   Vaping Use: Some days  Substance Use  Topics   Alcohol use: Yes    Comment: occas.   Drug use: No     Allergies   Patient has no known allergies.   Review of Systems Review of Systems  As stated above in HPI Physical Exam Triage Vital Signs ED Triage Vitals  Enc Vitals Group     BP 07/17/20 0823 (!) 164/113     Pulse Rate 07/17/20 0823 (!) 107     Resp 07/17/20 0823 20     Temp 07/17/20 0823 99.4 F (37.4 C)     Temp Source 07/17/20 0823 Oral     SpO2 07/17/20 0823 99 %     Weight --      Height --      Head Circumference --      Peak Flow --      Pain Score 07/17/20 0819 8     Pain Loc --      Pain Edu? --      Excl. in GC? --    No data  found.  Updated Vital Signs BP (!) 147/111 (BP Location: Right Arm)   Pulse (!) 107   Temp 99.4 F (37.4 C) (Oral)   Resp 20   SpO2 99%   Physical Exam Vitals and nursing note reviewed.  Constitutional:      General: She is not in acute distress.    Appearance: Normal appearance. She is not ill-appearing, toxic-appearing or diaphoretic.  HENT:     Head: Normocephalic and atraumatic.     Right Ear: Tympanic membrane and ear canal normal.     Left Ear: Tympanic membrane and ear canal normal.     Nose: Nose normal.     Mouth/Throat:     Mouth: Mucous membranes are moist.     Pharynx: Oropharyngeal exudate and posterior oropharyngeal erythema present.  Eyes:     Extraocular Movements: Extraocular movements intact.     Pupils: Pupils are equal, round, and reactive to light.  Cardiovascular:     Rate and Rhythm: Normal rate and regular rhythm.     Heart sounds: Normal heart sounds.  Pulmonary:     Effort: Pulmonary effort is normal.     Breath sounds: Normal breath sounds.  Musculoskeletal:        General: Tenderness (right SI joint area) present. Normal range of motion.     Cervical back: Normal range of motion and neck supple.     Comments: Normal straight leg raise bilaterally, normal strength   Lymphadenopathy:     Cervical: Cervical adenopathy  present.  Skin:    General: Skin is warm.  Neurological:     Mental Status: She is alert and oriented to person, place, and time.     Comments: DTRs 2+ bilaterally      UC Treatments / Results  Labs (all labs ordered are listed, but only abnormal results are displayed) Labs Reviewed  CULTURE, GROUP A STREP ([facility])  SARS CORONAVIRUS 2 (TAT 6-24 HRS)  POCT RAPID STREP A, ED / UC    EKG   Radiology No results found.  Procedures Procedures (including critical care time)  Medications Ordered in UC Medications - No data to display  Initial Impression / Assessment and Plan / UC Course  I have reviewed the triage vital signs and the nursing notes.  Pertinent labs & imaging results that were available during my care of the patient were reviewed by me and considered in my medical decision making (see chart for details).     New.  Testing for strep (rapid is negative) and COVID-19 for her cold symptoms.  In terms of her sciatica this is likely flared given her current illness.  I would recommend NSAIDs or Tylenol along with stretching exercises.  Discussed red flag signs and symptoms. Final Clinical Impressions(s) / UC Diagnoses   Final diagnoses:  Sciatica of right side  Acute pharyngitis, unspecified etiology   Discharge Instructions   None    ED Prescriptions   None    PDMP not reviewed this encounter.   Rushie Chestnut, Cordelia Poche 07/17/20 0916    Rushie Chestnut, PA-C 07/17/20 [phone]

## 2020-07-18 LAB — CULTURE, GROUP A STREP (THRC)

## 2020-07-20 ENCOUNTER — Telehealth (HOSPITAL_COMMUNITY): Payer: Self-pay | Admitting: Emergency Medicine

## 2020-07-20 MED ORDER — PENICILLIN V POTASSIUM 500 MG PO TABS: 500.0000 mg | ORAL_TABLET | Freq: Two times a day (BID) | ORAL | 0 refills | Status: AC

## 2021-06-29 ENCOUNTER — Encounter (HOSPITAL_COMMUNITY): Payer: Self-pay | Admitting: *Deleted

## 2021-06-29 ENCOUNTER — Ambulatory Visit (HOSPITAL_COMMUNITY)
Admission: EM | Admit: 2021-06-29 | Discharge: 2021-06-29 | Disposition: A | Discharge status: Home / Self Care | Payer: BC Managed Care – PPO | Attending: Family Medicine | Admitting: Family Medicine

## 2021-06-29 ENCOUNTER — Other Ambulatory Visit: Payer: Self-pay

## 2021-06-29 DIAGNOSIS — H1033 Unspecified acute conjunctivitis, bilateral: Secondary | ICD-10-CM | POA: Diagnosis not present

## 2021-06-29 MED ORDER — GENTAMICIN SULFATE 0.3 % OP SOLN: 2.0000 [drp] | Freq: Three times a day (TID) | OPHTHALMIC | 0 refills | Status: AC

## 2021-06-29 MED ORDER — CEPHALEXIN 250 MG PO CAPS: 250.0000 mg | ORAL_CAPSULE | Freq: Three times a day (TID) | ORAL | 0 refills | Status: AC

## 2021-06-29 NOTE — ED Triage Notes (Signed)
Pt reports eye drainage started yesterday.

## 2021-06-29 NOTE — ED Provider Notes (Signed)
[facility]    CSN: [id number] Arrival date & time: 06/29/21  3664      History   Chief Complaint Chief Complaint  Patient presents with   Eye Drainage    HPI Lisa Mack is a 41 y.o. female.   HPI Here with bilateral eye irritation, drainage, and puffiness to both eyelids.  Began yesterday.  No fever or cough.  Her throat has been a little sore.  Past Medical History:  Diagnosis Date   Abnormal vaginal bleeding     There are no problems to display for this patient.   Past Surgical History:  Procedure Laterality Date   hand surgery Left 1993   VAGINAL DELIVERY      OB History     Gravida  5   Para  5   Term  4   Preterm  1   AB      Living  5      SAB      IAB      Ectopic      Multiple      Live Births               Home Medications    Prior to Admission medications   Medication Sig Start Date End Date Taking? Authorizing Provider  cephALEXin (KEFLEX) 250 MG capsule Take 1 capsule (250 mg total) by mouth 3 (three) times daily for 5 days. 06/29/21 07/04/21 Yes Sheyla Zaffino, Janace Aris, MD  gentamicin (GARAMYCIN) 0.3 % ophthalmic solution Place 2 drops into both eyes 3 (three) times daily for 5 days. 06/29/21 07/04/21 Yes Diogenes Whirley, Janace Aris, MD  PARAGARD INTRAUTERINE COPPER IU 1 each by Intrauterine route continuous. Birth control. Copper IUD.  Placed 06/21/2007 05/30/07   [provider]    Family History Family History  Problem Relation Age of Onset   Healthy Father     Social History Social History   Tobacco Use   Smoking status: Every Day    Packs/day: 0.50    Types: Cigarettes   Smokeless tobacco: Never  Vaping Use   Vaping Use: Some days  Substance Use Topics   Alcohol use: Yes    Comment: occas.   Drug use: No     Allergies   Patient has no known allergies.   Review of Systems Review of Systems   Physical Exam Triage Vital Signs ED Triage Vitals  Enc Vitals Group     BP 06/29/21 1025 (!)  142/97     Pulse Rate 06/29/21 1025 80     Resp 06/29/21 1025 16     Temp 06/29/21 1025 98.8 F (37.1 C)     Temp src --      SpO2 06/29/21 1025 98 %     Weight --      Height --      Head Circumference --      Peak Flow --      Pain Score 06/29/21 1022 7     Pain Loc --      Pain Edu? --      Excl. in GC? --    No data found.  Updated Vital Signs BP (!) 142/97   Pulse 80   Temp 98.8 F (37.1 C)   Resp 16   SpO2 98%   Visual Acuity Right Eye Distance:   Left Eye Distance:   Bilateral Distance:    Right Eye Near:   Left Eye Near:    Bilateral Near:  Physical Exam Vitals reviewed.  Constitutional:      General: She is not in acute distress.    Appearance: She is not ill-appearing, toxic-appearing or diaphoretic.  HENT:     Nose: Nose normal.     Mouth/Throat:     Mouth: Mucous membranes are moist.     Comments: There is some clear mucus in the oropharynx, but no erythema or tonsillar hypertrophy Eyes:     Extraocular Movements: Extraocular movements intact.     Pupils: Pupils are equal, round, and reactive to light.     Comments: There is bilateral injection of the eyes.  Both upper eyelids are swollen, and mildly erythematous.  There is dried discharge on the lashes  Cardiovascular:     Rate and Rhythm: Normal rate and regular rhythm.     Heart sounds: No murmur heard. Pulmonary:     Effort: Pulmonary effort is normal.     Breath sounds: Normal breath sounds.  Lymphadenopathy:     Cervical: No cervical adenopathy.  Skin:    Coloration: Skin is not jaundiced or pale.  Neurological:     Mental Status: She is alert and oriented to person, place, and time.  Psychiatric:        Behavior: Behavior normal.     UC Treatments / Results  Labs (all labs ordered are listed, but only abnormal results are displayed) Labs Reviewed - No data to display  EKG   Radiology No results found.  Procedures Procedures (including critical care  time)  Medications Ordered in UC Medications - No data to display  Initial Impression / Assessment and Plan / UC Course  I have reviewed the triage vital signs and the nursing notes.  Pertinent labs & imaging results that were available during my care of the patient were reviewed by me and considered in my medical decision making (see chart for details).     I am going to treat for acute conjunctivitis and preseptal cellulitis. Final Clinical Impressions(s) / UC Diagnoses   Final diagnoses:  Acute conjunctivitis of both eyes, unspecified acute conjunctivitis type     Discharge Instructions      Put gentamicin eyedrops in both eyes 3 times a day for 5 days  Cephalexin 250 mg--1 capsule 3 times daily for 5 days     ED Prescriptions     Medication Sig Dispense Auth. Provider   gentamicin (GARAMYCIN) 0.3 % ophthalmic solution Place 2 drops into both eyes 3 (three) times daily for 5 days. 5 mL Zenia Resides, MD   cephALEXin (KEFLEX) 250 MG capsule Take 1 capsule (250 mg total) by mouth 3 (three) times daily for 5 days. 15 capsule Marlinda Mike Janace Aris, MD      PDMP not reviewed this encounter.   Zenia Resides, MD 06/29/21 1101

## 2021-06-29 NOTE — Discharge Instructions (Addendum)
Put gentamicin eyedrops in both eyes 3 times a day for 5 days  Cephalexin 250 mg--1 capsule 3 times daily for 5 days

## 2021-10-21 ENCOUNTER — Other Ambulatory Visit: Payer: Self-pay | Admitting: Obstetrics and Gynecology

## 2021-10-21 DIAGNOSIS — N6320 Unspecified lump in the left breast, unspecified quadrant: Secondary | ICD-10-CM

## 2021-10-29 ENCOUNTER — Ambulatory Visit
Admission: RE | Admit: 2021-10-29 | Discharge: 2021-10-29 | Disposition: A | Discharge status: Home / Self Care | Payer: BC Managed Care – PPO | Source: Ambulatory Visit | Attending: Obstetrics and Gynecology | Admitting: Obstetrics and Gynecology

## 2021-10-29 DIAGNOSIS — N6320 Unspecified lump in the left breast, unspecified quadrant: Secondary | ICD-10-CM

## 2022-02-16 ENCOUNTER — Encounter: Payer: Self-pay | Admitting: Critical Care Medicine

## 2022-02-17 NOTE — Progress Notes (Signed)
This is a pleasant 42 year old female who just arrived at [facility] a month ago she is having headaches would like her blood pressure checked she does not have a primary care provider denies any chronic health conditions has no medication allergies is on no medications.  There are no recent ER visits.  There is no alcohol or other substance use or smoking.  On exam blood pressure is 141/87 saturation 97% room air pulse 89 her exam is entirely unremarkable  Plan is to give patient samples of ibuprofen and Tylenol for pain and to monitor her for now she does not wish to require primary care and we can offer this in the future should she desire

## 2022-04-06 ENCOUNTER — Other Ambulatory Visit: Payer: Self-pay

## 2022-04-06 ENCOUNTER — Other Ambulatory Visit: Payer: Self-pay | Admitting: Family Medicine

## 2022-04-06 ENCOUNTER — Encounter: Payer: Self-pay | Admitting: Physician Assistant

## 2022-04-06 MED ORDER — ALBUTEROL SULFATE HFA 108 (90 BASE) MCG/ACT IN AERS
2.0000 | INHALATION_SPRAY | Freq: Four times a day (QID) | RESPIRATORY_TRACT | 2 refills | Status: DC | PRN
  Filled 2022-04-06: qty 6.7, 25d supply, fill #0

## 2022-04-06 MED ORDER — PREDNISONE 20 MG PO TABS
40.0000 mg | ORAL_TABLET | Freq: Every day | ORAL | 0 refills | Status: AC
  Filled 2022-04-06: qty 10, 5d supply, fill #0

## 2022-04-06 MED ORDER — BUDESONIDE-FORMOTEROL FUMARATE 160-4.5 MCG/ACT IN AERO
2.0000 | INHALATION_SPRAY | Freq: Two times a day (BID) | RESPIRATORY_TRACT | 1 refills | Status: AC
  Filled 2022-04-06: qty 10.2, 30d supply, fill #0

## 2022-04-06 MED ORDER — NICOTINE 21 MG/24HR TD PT24
21.0000 mg | MEDICATED_PATCH | TRANSDERMAL | 1 refills | Status: AC
  Filled 2022-04-06: qty 28, 28d supply, fill #0

## 2022-04-06 MED ORDER — ALBUTEROL SULFATE HFA 108 (90 BASE) MCG/ACT IN AERS
2.0000 | INHALATION_SPRAY | Freq: Four times a day (QID) | RESPIRATORY_TRACT | 2 refills | Status: DC | PRN
  Filled 2022-04-06: qty 6.7, fill #0

## 2022-04-06 MED ORDER — BUPROPION HCL ER (SR) 150 MG PO TB12
ORAL_TABLET | ORAL | 0 refills | Status: AC
  Filled 2022-04-06: qty 63, 33d supply, fill #0

## 2022-04-06 MED ORDER — NICOTINE 21 MG/24HR TD PT24
21.0000 mg | MEDICATED_PATCH | TRANSDERMAL | 1 refills | Status: DC
  Filled 2022-04-06: qty 28, 28d supply, fill #0

## 2022-04-06 MED ORDER — PREDNISONE 20 MG PO TABS
40.0000 mg | ORAL_TABLET | Freq: Every day | ORAL | 0 refills | Status: DC
  Filled 2022-04-06: qty 10, 5d supply, fill #0

## 2022-04-06 MED ORDER — BUPROPION HCL ER (SR) 150 MG PO TB12
ORAL_TABLET | ORAL | 0 refills | Status: DC
  Filled 2022-04-06: qty 63, 33d supply, fill #0

## 2022-04-06 NOTE — Progress Notes (Signed)
Pt seen by Dr Florene Glen.  She thinks she has asthma. It started sometime after she started smoking. Has been going on 4-5 years. No problems w/ asthma as a child. Has not been sick.   She quit smoking at one point, but started back when she came to the shelter. She is willing to try nicotine patches.   She was offered Chantix and Wellbutrin. She will get Wellbutrin and nicotine patches, thinks she can handle the psychological urge to smoke.   She quit previously, started back during a time of stress.   Pt has problems w/ coughing and wheezing, especially at night. Sometimes productive of phlegmy sputum.   Will try short course of steroids.   Someone gave her an inhaler, not sure what kind. She uses it several times a day, which helps a little.  She will be given an albuterol inhaler and a spacer.   She has an inhaler that tells her how much nicotine in her body. She is also vaping. Says she can quit vaping.   Today's Vitals   04/06/22 1518  BP: 125/86  Pulse: 98  SpO2: 92%   There is no height or weight on file to calculate BMI.  To summarize, she will get alb inh, steroids, Wellbutrin and nicotine patches.   No insurance, Rx will go to [facility].  Rosaria Ferries, PA-C 04/06/2022 3:27 PM

## 2022-04-07 ENCOUNTER — Other Ambulatory Visit: Payer: Self-pay

## 2022-04-07 ENCOUNTER — Other Ambulatory Visit (HOSPITAL_COMMUNITY): Payer: Self-pay

## 2022-04-27 ENCOUNTER — Other Ambulatory Visit: Payer: Self-pay | Admitting: Critical Care Medicine

## 2022-04-27 ENCOUNTER — Encounter: Payer: Self-pay | Admitting: Physician Assistant

## 2022-04-27 ENCOUNTER — Other Ambulatory Visit (HOSPITAL_COMMUNITY): Payer: Self-pay

## 2022-04-27 MED ORDER — AZITHROMYCIN 250 MG PO TABS
ORAL_TABLET | ORAL | 0 refills | Status: AC
  Filled 2022-04-27: qty 6, 5d supply, fill #0

## 2022-04-27 MED ORDER — PREDNISONE 10 MG PO TABS
ORAL_TABLET | ORAL | 0 refills | Status: AC
  Filled 2022-04-27: qty 20, 5d supply, fill #0

## 2022-04-27 MED ORDER — ALBUTEROL SULFATE HFA 108 (90 BASE) MCG/ACT IN AERS
2.0000 | INHALATION_SPRAY | Freq: Four times a day (QID) | RESPIRATORY_TRACT | 2 refills | Status: AC | PRN
  Filled 2022-04-27: qty 6.7, 25d supply, fill #0

## 2022-04-27 NOTE — Progress Notes (Signed)
refills  

## 2022-04-27 NOTE — Progress Notes (Signed)
Pt seen by Dr Joya Gaskins.  She got the Symbicort but did not get the albuterol inh.  The steroid taper was not sent in.  Coughing up phlegm daily, some greenish, some yellowish.   No ear infection on exam. +wheezing and rhonchi on exam.   Nasal inflammation >> was given saline spray and Flonase.   She will need alb rescue inh, steroid burst and Zpack for URI.   She needs a PCP and a message was sent to get her an appt.   Rosaria Ferries, PA-C 04/27/2022 4:00 PM

## 2022-05-12 ENCOUNTER — Ambulatory Visit: Payer: BC Managed Care – PPO | Admitting: Critical Care Medicine

## 2022-05-12 NOTE — Progress Notes (Deleted)
New Patient Office Visit  Subjective    Patient ID: Lisa Mack, female    DOB: May 09, 1980  Age: 42 y.o. MRN: [id number]  CC: No chief complaint on file.   HPI Lisa Mack presents to establish care [facility] Pt seen by Dr Delford Field.   She got the Symbicort but did not get the albuterol inh.   The steroid taper was not sent in.   Coughing up phlegm daily, some greenish, some yellowish.    No ear infection on exam. +wheezing and rhonchi on exam.    Nasal inflammation >> was given saline spray and Flonase.    She will need alb rescue inh, steroid burst and Zpack for URI.    She needs a PCP and a message was sent to get her an appt.     Outpatient Encounter Medications as of 05/12/2022  Medication Sig   albuterol (VENTOLIN HFA) 108 (90 Base) MCG/ACT inhaler Inhale 2 puffs into the lungs every 6 (six) hours as needed for wheezing or shortness of breath.   azithromycin (ZITHROMAX) 250 MG tablet take 2 tablets today, then 1 tablet once a day for 4 more days   budesonide-formoterol (SYMBICORT) 160-4.5 MCG/ACT inhaler Inhale 2 puffs into the lungs 2 (two) times daily.   buPROPion (WELLBUTRIN SR) 150 MG 12 hr tablet Take 1 tablet (150 mg total) by mouth daily for 3 days, THEN 1 tablet (150 mg total) 2 (two) times daily.   nicotine (NICODERM CQ - DOSED IN MG/24 HOURS) 21 mg/24hr patch Place 1 patch (21 mg total) onto the skin daily.   PARAGARD INTRAUTERINE COPPER IU 1 each by Intrauterine route continuous. Birth control. Copper IUD.  Placed 06/21/2007   predniSONE (DELTASONE) 10 MG tablet Take 4 tablets daily for 5 days then stop   No facility-administered encounter medications on file as of 05/12/2022.    Past Medical History:  Diagnosis Date   Abnormal vaginal bleeding    Asthma     Past Surgical History:  Procedure Laterality Date   hand surgery Left 1993   VAGINAL DELIVERY      Family History  Problem Relation Age of Onset   Healthy Father      Social History   Socioeconomic History   Marital status: Single    Spouse name: Not on file   Number of children: Not on file   Years of education: Not on file   Highest education level: Not on file  Occupational History   Not on file  Tobacco Use   Smoking status: Every Day    Packs/day: .5    Types: Cigarettes   Smokeless tobacco: Never  Vaping Use   Vaping Use: Some days  Substance and Sexual Activity   Alcohol use: Yes    Comment: occas.   Drug use: No   Sexual activity: Yes    Birth control/protection: I.U.D.  Other Topics Concern   Not on file  Social History Narrative   Not on file   Social Determinants of Health   Financial Resource Strain: Not on file  Food Insecurity: Not on file  Transportation Needs: Not on file  Physical Activity: Not on file  Stress: Not on file  Social Connections: Not on file  Intimate Partner Violence: Not on file    ROS      Objective    There were no vitals taken for this visit.  Physical Exam  {Labs (Optional):23779}    Assessment & Plan:  Problem List Items Addressed This Visit   None   No follow-ups on file.   Asencion Noble, MD

## 2023-03-20 ENCOUNTER — Emergency Department (HOSPITAL_BASED_OUTPATIENT_CLINIC_OR_DEPARTMENT_OTHER): Payer: 59 | Admitting: Radiology

## 2023-03-20 ENCOUNTER — Other Ambulatory Visit (HOSPITAL_BASED_OUTPATIENT_CLINIC_OR_DEPARTMENT_OTHER): Payer: Self-pay

## 2023-03-20 ENCOUNTER — Other Ambulatory Visit: Payer: Self-pay

## 2023-03-20 ENCOUNTER — Encounter (HOSPITAL_BASED_OUTPATIENT_CLINIC_OR_DEPARTMENT_OTHER): Payer: Self-pay | Admitting: *Deleted

## 2023-03-20 ENCOUNTER — Emergency Department (HOSPITAL_BASED_OUTPATIENT_CLINIC_OR_DEPARTMENT_OTHER)
Admission: EM | Admit: 2023-03-20 | Discharge: 2023-03-20 | Disposition: A | Discharge status: Home / Self Care | Payer: 59 | Attending: Emergency Medicine | Admitting: Emergency Medicine

## 2023-03-20 DIAGNOSIS — W108XXA Fall (on) (from) other stairs and steps, initial encounter: Secondary | ICD-10-CM | POA: Insufficient documentation

## 2023-03-20 DIAGNOSIS — J45909 Unspecified asthma, uncomplicated: Secondary | ICD-10-CM | POA: Insufficient documentation

## 2023-03-20 DIAGNOSIS — S8265XA Nondisplaced fracture of lateral malleolus of left fibula, initial encounter for closed fracture: Secondary | ICD-10-CM | POA: Insufficient documentation

## 2023-03-20 DIAGNOSIS — Z7951 Long term (current) use of inhaled steroids: Secondary | ICD-10-CM | POA: Insufficient documentation

## 2023-03-20 DIAGNOSIS — Y9301 Activity, walking, marching and hiking: Secondary | ICD-10-CM | POA: Insufficient documentation

## 2023-03-20 DIAGNOSIS — W19XXXA Unspecified fall, initial encounter: Secondary | ICD-10-CM

## 2023-03-20 DIAGNOSIS — M25572 Pain in left ankle and joints of left foot: Secondary | ICD-10-CM | POA: Diagnosis present

## 2023-03-20 MED ORDER — OXYCODONE-ACETAMINOPHEN 5-325 MG PO TABS
1.0000 | ORAL_TABLET | Freq: Once | ORAL | Status: AC
  Administered 2023-03-20: 1 via ORAL
  Filled 2023-03-20: qty 1

## 2023-03-20 MED ORDER — HYDROCODONE-ACETAMINOPHEN 5-325 MG PO TABS
1.0000 | ORAL_TABLET | Freq: Four times a day (QID) | ORAL | 0 refills | Status: AC | PRN
  Filled 2023-03-20: qty 10, 3d supply, fill #0

## 2023-03-20 NOTE — ED Notes (Signed)

## 2023-03-20 NOTE — ED Provider Notes (Signed)
 [place] [facility] AT [facility] Provider Note   CSN: [id number] Arrival date & time: 03/20/23  1051     History  Chief Complaint  Patient presents with   Ankle Pain    Lisa Mack is a 43 y.o. female.  With history of asthma presenting to the ED for evaluation of a fall.  She states she was walking down steps yesterday at 5 AM when she slipped.  Her left leg folded behind her.  She landed on her hands and knees.  She did not hit her head.  No loss of consciousness or anticoagulation.  She complains mostly of left lateral ankle and foot pain.  No numbness, weakness or tingling.  She has been unable to ambulate since the incident.   Ankle Pain      Home Medications Prior to Admission medications   Medication Sig Start Date End Date Taking? Authorizing Provider  HYDROcodone-acetaminophen (NORCO/VICODIN) 5-325 MG tablet Take 1 tablet by mouth every 6 (six) hours as needed. 03/20/23  Yes Jarone Ostergaard, Edsel Petrin, PA-C  albuterol (VENTOLIN HFA) 108 (90 Base) MCG/ACT inhaler Inhale 2 puffs into the lungs every 6 (six) hours as needed for wheezing or shortness of breath. 04/27/22   Storm Frisk, MD  azithromycin (ZITHROMAX) 250 MG tablet take 2 tablets today, then 1 tablet once a day for 4 more days 04/27/22   Storm Frisk, MD  budesonide-formoterol [facility]) 160-4.5 MCG/ACT inhaler Inhale 2 puffs into the lungs 2 (two) times daily. 04/06/22   Zigmund Daniel., MD  buPROPion [facility] SR) 150 MG 12 hr tablet Take 1 tablet (150 mg total) by mouth daily for 3 days, THEN 1 tablet (150 mg total) 2 (two) times daily. 04/06/22 05/09/22  Zigmund Daniel., MD  PARAGARD INTRAUTERINE COPPER IU 1 each by Intrauterine route continuous. Birth control. Copper IUD.  Placed 06/21/2007 05/30/07   [provider]  predniSONE (DELTASONE) 10 MG tablet Take 4 tablets daily for 5 days then stop 04/27/22   Storm Frisk, MD      Allergies    Patient has no  known allergies.    Review of Systems   Review of Systems  Musculoskeletal:  Positive for arthralgias.  All other systems reviewed and are negative.   Physical Exam Updated Vital Signs BP 129/84   Pulse 94   Temp 97.6 F (36.4 C) (Oral)   Resp 16   SpO2 98%  Physical Exam Vitals and nursing note reviewed.  Constitutional:      General: She is not in acute distress.    Appearance: Normal appearance. She is normal weight. She is not ill-appearing.  HENT:     Head: Normocephalic and atraumatic.  Pulmonary:     Effort: Pulmonary effort is normal. No respiratory distress.  Abdominal:     General: Abdomen is flat.  Musculoskeletal:     Cervical back: Neck supple.     Comments: Swelling and exquisite TTP to left lateral malleolus and midfoot.  No bruising or overlying lesions.  DP pulse 2+.  Sensation intact distally.  Capillary refill normal.  She is able to wiggle all of her toes.  Significantly decreased ROM in ankle dorsiflexion and plantarflexion.  Skin:    General: Skin is warm and dry.  Neurological:     Mental Status: She is alert and oriented to person, place, and time.  Psychiatric:        Mood and Affect: Mood normal.  Behavior: Behavior normal.     ED Results / Procedures / Treatments   Labs (all labs ordered are listed, but only abnormal results are displayed) Labs Reviewed - No data to display  EKG None  Radiology DG Foot Complete Left Result Date: 03/20/2023 CLINICAL DATA:  Lateral pain in foot and ankle, fall. EXAM: LEFT FOOT - COMPLETE 3 VIEW COMPARISON:  None Available. FINDINGS: Oblique lateral malleolar fracture as described on dedicated ankle radiographs. Overlying soft tissue swelling noted. Plantar and Achilles calcaneal spurs. No discrete fracture or malalignment elsewhere in the foot. IMPRESSION: 1. Oblique lateral malleolar fracture as described on dedicated ankle radiographs. 2. Plantar and Achilles calcaneal spurs. Electronically Signed    By: Gaylyn Rong M.D.   On: 03/20/2023 13:33   DG Ankle Complete Left Result Date: 03/20/2023 CLINICAL DATA:  Fall, ankle pain EXAM: LEFT ANKLE COMPLETE - 3+ VIEW COMPARISON:  None Available. FINDINGS: Oblique fracture of the lateral malleolus. No definite tibiotalar malalignment, posterior malleolar, or medial malleolar fracture. Appearance favors Weber B stage 2 fracture pattern. Expected subcutaneous edema overlies the lateral malleolus and tracks cephalad in the calf. Plafond and and talar dome appear intact. Plantar and Achilles calcaneal spurs noted. IMPRESSION: 1. Oblique fracture of the lateral malleolus, with overlying subcutaneous edema. Appearance favors Weber B stage 2 fracture pattern, although strictly speaking disruption of the posterior syndesmosis is not entirely excluded. 2. Plantar and Achilles calcaneal spurs. Electronically Signed   By: Gaylyn Rong M.D.   On: 03/20/2023 13:31    Procedures Procedures    Medications Ordered in ED Medications  oxyCODONE-acetaminophen (PERCOCET/ROXICET) 5-325 MG per tablet 1 tablet (1 tablet Oral Given 03/20/23 1244)    ED Course/ Medical Decision Making/ A&P                                 Medical Decision Making Amount and/or Complexity of Data Reviewed Radiology: ordered.  Risk Prescription drug management.  This patient presents to the ED for concern of fall, left ankle pain, this involves an extensive number of treatment options, and is a complaint that carries with it a high risk of complications and morbidity.  The differential diagnosis includes fracture, strain, sprain, contusion, dislocation  My initial workup includes imaging, symptom control  Additional history obtained from: Nursing notes from this visit.  I ordered imaging studies including x-ray left ankle, left foot I independently visualized and interpreted imaging which showed left distal fibular fracture, Weber 2. I agree with the radiologist  interpretation  43 year old female presenting to the ED for evaluation of left ankle pain after fall that occurred yesterday morning.  Localized to the left lateral malleolus.  She has some foot pain as well.  No numbness or tingling.  Compartments are soft, pulses intact on exam.  Neurovascular status intact.  X-ray imaging concerning for distal fibular fracture.  Patient was placed in a cam walking boot and given crutches.  She was encouraged to be nonweightbearing until she follows up with orthopedics.  Contact information was given.  She was given a work note.  She was given a short course of pain medication and educated on potential side effects.  She was given return precautions.  Stable at discharge.  At this time there does not appear to be any evidence of an acute emergency medical condition and the patient appears stable for discharge with appropriate outpatient follow up. Diagnosis was discussed with patient who verbalizes  understanding of care plan and is agreeable to discharge. I have discussed return precautions with patient who verbalizes understanding. Patient encouraged to follow-up with their PCP within 1 week. All questions answered.  Note: Portions of this report may have been transcribed using voice recognition software. Every effort was made to ensure accuracy; however, inadvertent computerized transcription errors may still be present.        Final Clinical Impression(s) / ED Diagnoses Final diagnoses:  Closed nondisplaced fracture of lateral malleolus of left fibula, initial encounter  Fall, initial encounter    Rx / DC Orders ED Discharge Orders          Ordered    HYDROcodone-acetaminophen (NORCO/VICODIN) 5-325 MG tablet  Every 6 hours PRN        03/20/23 1342              Michelle Piper, Cordelia Poche 03/20/23 1344    Jacalyn Lefevre, MD 03/20/23 1510

## 2023-03-20 NOTE — ED Triage Notes (Signed)
 Pt arrives by [facility] due to fall yesterday.  Pt missed a step while coming down stairs in the dark and has been having left ankle pain since.  Ice in place

## 2023-03-20 NOTE — Discharge Instructions (Signed)
 You have been seen today for your complaint of left ankle pain. Your imaging revealed a fracture of the left ankle. Your discharge medications include Norco. This is an opioid pain medication. You should only take this medication as needed for severe pain. You should not drive, operate heavy machinery or make important decisions while taking this medication. You should use alternative methods for pain relief while taking this medication including stretching, gentle range of motion, and alternating tylenol and ibuprofen. Home care instructions are as follows:  Wear the boot, try to avoid weightbearing until you follow-up with orthopedics. Follow up with: Dr. Shon Baton.  He is an [employer], operational.  Call to schedule follow-up appointment.  If you cannot make an appointment in the [facility] area, call any orthopedic surgeon in your area to schedule a follow-up appointment. Please seek immediate medical care if you develop any of the following symptoms: You develop severe pain or more swelling in your ankle, leg, or foot that cannot be controlled with medicines. Your skin or nails below the injury turn blue or gray, feel cold, or become numb. The skin under your cast burns or stings. There is a bad smell or pus coming from under the cast. You cannot move your toes. At this time there does not appear to be the presence of an emergent medical condition, however there is always the potential for conditions to change. Please read and follow the below instructions.  Do not take your medicine if  develop an itchy rash, swelling in your mouth or lips, or difficulty breathing; call 911 and seek immediate emergency medical attention if this occurs.  You may review your lab tests and imaging results in their entirety on your MyChart account.  Please discuss all results of fully with your primary care provider and other specialist at your follow-up visit.  Note: Portions of this text may have been transcribed  using voice recognition software. Every effort was made to ensure accuracy; however, inadvertent computerized transcription errors may still be present.

## 2023-10-18 ENCOUNTER — Other Ambulatory Visit: Payer: Self-pay
